# Patient Record
Sex: Male | Born: 1975 | Race: White | Hispanic: No | Marital: Single | Smoking: Former smoker
Health system: Southern US, Community
[De-identification: ages and names within clinical notes are randomized; demographics above are authoritative.]

## PROBLEM LIST (undated history)

## (undated) DIAGNOSIS — E785 Hyperlipidemia, unspecified: Secondary | ICD-10-CM

## (undated) DIAGNOSIS — F419 Anxiety disorder, unspecified: Secondary | ICD-10-CM

## (undated) DIAGNOSIS — G4730 Sleep apnea, unspecified: Secondary | ICD-10-CM

## (undated) DIAGNOSIS — G039 Meningitis, unspecified: Secondary | ICD-10-CM

## (undated) DIAGNOSIS — Z87442 Personal history of urinary calculi: Secondary | ICD-10-CM

## (undated) DIAGNOSIS — R7303 Prediabetes: Secondary | ICD-10-CM

## (undated) HISTORY — PX: TENDON REPAIR: SHX5111

---

## 2011-01-02 ENCOUNTER — Emergency Department (HOSPITAL_COMMUNITY): Payer: Self-pay

## 2011-01-02 ENCOUNTER — Encounter (HOSPITAL_COMMUNITY): Payer: Self-pay | Admitting: Anesthesiology

## 2011-01-02 ENCOUNTER — Inpatient Hospital Stay (HOSPITAL_COMMUNITY)
Admission: EM | Admit: 2011-01-02 | Discharge: 2011-01-05 | DRG: 501 | Disposition: A | Discharge status: Home / Self Care | Payer: No Typology Code available for payment source | Attending: Orthopedic Surgery | Admitting: Orthopedic Surgery

## 2011-01-02 ENCOUNTER — Inpatient Hospital Stay (HOSPITAL_COMMUNITY): Payer: No Typology Code available for payment source | Admitting: Anesthesiology

## 2011-01-02 ENCOUNTER — Other Ambulatory Visit: Payer: Self-pay

## 2011-01-02 ENCOUNTER — Encounter (HOSPITAL_COMMUNITY)
Admission: EM | Disposition: A | Discharge status: Home / Self Care | Payer: Self-pay | Source: Home / Self Care | Attending: Orthopedic Surgery

## 2011-01-02 DIAGNOSIS — Y998 Other external cause status: Secondary | ICD-10-CM

## 2011-01-02 DIAGNOSIS — S8990XA Unspecified injury of unspecified lower leg, initial encounter: Secondary | ICD-10-CM

## 2011-01-02 DIAGNOSIS — S8253XA Displaced fracture of medial malleolus of unspecified tibia, initial encounter for closed fracture: Principal | ICD-10-CM | POA: Diagnosis present

## 2011-01-02 DIAGNOSIS — Y92410 Unspecified street and highway as the place of occurrence of the external cause: Secondary | ICD-10-CM

## 2011-01-02 DIAGNOSIS — IMO0002 Reserved for concepts with insufficient information to code with codable children: Secondary | ICD-10-CM | POA: Diagnosis present

## 2011-01-02 DIAGNOSIS — S81009A Unspecified open wound, unspecified knee, initial encounter: Secondary | ICD-10-CM | POA: Diagnosis present

## 2011-01-02 DIAGNOSIS — F17200 Nicotine dependence, unspecified, uncomplicated: Secondary | ICD-10-CM | POA: Diagnosis present

## 2011-01-02 DIAGNOSIS — S32009A Unspecified fracture of unspecified lumbar vertebra, initial encounter for closed fracture: Secondary | ICD-10-CM | POA: Diagnosis present

## 2011-01-02 HISTORY — DX: Meningitis, unspecified: G03.9

## 2011-01-02 HISTORY — PX: IRRIGATION AND DEBRIDEMENT KNEE: SHX5185

## 2011-01-02 LAB — URINALYSIS, MICROSCOPIC ONLY
Bilirubin Urine: NEGATIVE
Nitrite: NEGATIVE
Urobilinogen, UA: 0.2 mg/dL (ref 0.0–1.0)

## 2011-01-02 LAB — RAPID URINE DRUG SCREEN, HOSP PERFORMED
Amphetamines: NOT DETECTED
Barbiturates: NOT DETECTED
Benzodiazepines: NOT DETECTED
Cocaine: NOT DETECTED
Tetrahydrocannabinol: NOT DETECTED

## 2011-01-02 LAB — CBC
HCT: 45.2 % (ref 39.0–52.0)
Hemoglobin: 16.1 g/dL (ref 13.0–17.0)
MCH: 33.9 pg (ref 26.0–34.0)
MCHC: 35.6 g/dL (ref 30.0–36.0)
RBC: 4.75 MIL/uL (ref 4.22–5.81)

## 2011-01-02 LAB — POCT I-STAT, CHEM 8
HCT: 48 % (ref 39.0–52.0)
Hemoglobin: 16.3 g/dL (ref 13.0–17.0)
Potassium: 3.4 mEq/L — ABNORMAL LOW (ref 3.5–5.1)
Sodium: 141 mEq/L (ref 135–145)

## 2011-01-02 LAB — COMPREHENSIVE METABOLIC PANEL
ALT: 85 U/L — ABNORMAL HIGH (ref 0–53)
AST: 45 U/L — ABNORMAL HIGH (ref 0–37)
Calcium: 9.6 mg/dL (ref 8.4–10.5)
Creatinine, Ser: 0.96 mg/dL (ref 0.50–1.35)
Sodium: 138 mEq/L (ref 135–145)
Total Protein: 7.4 g/dL (ref 6.0–8.3)

## 2011-01-02 LAB — SAMPLE TO BLOOD BANK

## 2011-01-02 SURGERY — IRRIGATION AND DEBRIDEMENT KNEE
Anesthesia: General | Site: Knee | Laterality: Left | Wound class: Dirty or Infected

## 2011-01-02 MED ORDER — METOCLOPRAMIDE HCL 5 MG/ML IJ SOLN
5.0000 mg | Freq: Three times a day (TID) | INTRAMUSCULAR | Status: DC | PRN
  Filled 2011-01-02: qty 2

## 2011-01-02 MED ORDER — HYDROMORPHONE HCL PF 1 MG/ML IJ SOLN
1.0000 mg | INTRAMUSCULAR | Status: DC | PRN
  Administered 2011-01-02: 0.5 mg via INTRAVENOUS
  Administered 2011-01-02 (×2): 1 mg via INTRAVENOUS
  Administered 2011-01-02: 0.5 mg via INTRAVENOUS
  Administered 2011-01-03: 1 mg via INTRAVENOUS
  Filled 2011-01-02 (×3): qty 1

## 2011-01-02 MED ORDER — VANCOMYCIN HCL IN DEXTROSE 1-5 GM/200ML-% IV SOLN
1000.0000 mg | Freq: Once | INTRAVENOUS | Status: AC
  Administered 2011-01-02: 1000 mg via INTRAVENOUS
  Filled 2011-01-02: qty 200

## 2011-01-02 MED ORDER — IOHEXOL 300 MG/ML  SOLN
100.0000 mL | Freq: Once | INTRAMUSCULAR | Status: AC | PRN
  Administered 2011-01-02: 100 mL via INTRAVENOUS

## 2011-01-02 MED ORDER — ONDANSETRON HCL 4 MG/2ML IJ SOLN: 4.0000 mg | Freq: Four times a day (QID) | INTRAMUSCULAR | Status: DC | PRN

## 2011-01-02 MED ORDER — SENNOSIDES-DOCUSATE SODIUM 8.6-50 MG PO TABS: 1.0000 | ORAL_TABLET | Freq: Every evening | ORAL | Status: DC | PRN

## 2011-01-02 MED ORDER — MEPERIDINE HCL 25 MG/ML IJ SOLN: 6.2500 mg | INTRAMUSCULAR | Status: DC | PRN

## 2011-01-02 MED ORDER — SODIUM CHLORIDE 0.9 % IV BOLUS (SEPSIS): 1000.0000 mL | Freq: Once | INTRAVENOUS | Status: DC

## 2011-01-02 MED ORDER — VANCOMYCIN HCL 1000 MG IV SOLR
1000.0000 mg | INTRAVENOUS | Status: DC | PRN
  Administered 2011-01-02: 1 g via INTRAVENOUS

## 2011-01-02 MED ORDER — SUFENTANIL CITRATE 50 MCG/ML IV SOLN
INTRAVENOUS | Status: DC | PRN
  Administered 2011-01-02 (×2): 20 ug via INTRAVENOUS
  Administered 2011-01-02: 10 ug via INTRAVENOUS

## 2011-01-02 MED ORDER — FENTANYL CITRATE 0.05 MG/ML IJ SOLN
25.0000 ug | INTRAMUSCULAR | Status: DC | PRN
  Administered 2011-01-02: 25 ug via INTRAVENOUS

## 2011-01-02 MED ORDER — ONDANSETRON HCL 4 MG/2ML IJ SOLN
4.0000 mg | Freq: Once | INTRAMUSCULAR | Status: AC
  Administered 2011-01-02: 4 mg via INTRAVENOUS

## 2011-01-02 MED ORDER — LACTATED RINGERS IV SOLN
INTRAVENOUS | Status: DC | PRN
  Administered 2011-01-02 (×2): via INTRAVENOUS

## 2011-01-02 MED ORDER — HYDROMORPHONE HCL PF 1 MG/ML IJ SOLN
1.0000 mg | Freq: Once | INTRAMUSCULAR | Status: AC
  Administered 2011-01-02: 1 mg via INTRAVENOUS
  Filled 2011-01-02: qty 1

## 2011-01-02 MED ORDER — FENTANYL CITRATE 0.05 MG/ML IJ SOLN
50.0000 ug | Freq: Once | INTRAMUSCULAR | Status: AC
  Administered 2011-01-02: 50 ug via INTRAVENOUS

## 2011-01-02 MED ORDER — PROPOFOL 10 MG/ML IV EMUL
INTRAVENOUS | Status: DC | PRN
  Administered 2011-01-02: 200 mg via INTRAVENOUS

## 2011-01-02 MED ORDER — METOCLOPRAMIDE HCL 10 MG PO TABS
5.0000 mg | ORAL_TABLET | Freq: Three times a day (TID) | ORAL | Status: DC | PRN
Start: 2011-01-02 — End: 2011-01-05

## 2011-01-02 MED ORDER — HYDROMORPHONE HCL PF 1 MG/ML IJ SOLN
0.5000 mg | INTRAMUSCULAR | Status: DC | PRN
  Administered 2011-01-04 – 2011-01-05 (×9): 1 mg via INTRAVENOUS
  Filled 2011-01-02 (×9): qty 1

## 2011-01-02 MED ORDER — MORPHINE SULFATE 2 MG/ML IJ SOLN: 2.0000 mg | INTRAMUSCULAR | Status: DC | PRN

## 2011-01-02 MED ORDER — ONDANSETRON HCL 4 MG PO TABS: 4.0000 mg | ORAL_TABLET | Freq: Four times a day (QID) | ORAL | Status: DC | PRN

## 2011-01-02 MED ORDER — SUCCINYLCHOLINE CHLORIDE 20 MG/ML IJ SOLN
INTRAMUSCULAR | Status: DC | PRN
  Administered 2011-01-02: 100 mg via INTRAVENOUS

## 2011-01-02 MED ORDER — PROMETHAZINE HCL 25 MG/ML IJ SOLN: 6.2500 mg | INTRAMUSCULAR | Status: DC | PRN

## 2011-01-02 MED ORDER — SODIUM CHLORIDE 0.9 % IJ SOLN
INTRAMUSCULAR | Status: DC | PRN
  Administered 2011-01-02: 55 mL via INTRAVENOUS

## 2011-01-02 MED ORDER — ASPIRIN 325 MG PO TABS
325.0000 mg | ORAL_TABLET | Freq: Every day | ORAL | Status: DC
  Administered 2011-01-03 – 2011-01-05 (×3): 325 mg via ORAL
  Filled 2011-01-02 (×3): qty 1

## 2011-01-02 MED ORDER — METHYLENE BLUE 1 % INJ SOLN
INTRAMUSCULAR | Status: DC | PRN
  Administered 2011-01-02: 5 mL

## 2011-01-02 MED ORDER — HYDROMORPHONE HCL PF 1 MG/ML IJ SOLN
INTRAMUSCULAR | Status: AC
  Administered 2011-01-02: 0.5 mg via INTRAVENOUS
  Filled 2011-01-02: qty 1

## 2011-01-02 MED ORDER — METHOCARBAMOL 100 MG/ML IJ SOLN
500.0000 mg | Freq: Four times a day (QID) | INTRAVENOUS | Status: DC | PRN
  Administered 2011-01-04: 500 mg via INTRAVENOUS
  Filled 2011-01-02 (×2): qty 5

## 2011-01-02 MED ORDER — OXYCODONE-ACETAMINOPHEN 5-325 MG PO TABS
1.0000 | ORAL_TABLET | ORAL | Status: DC | PRN
  Administered 2011-01-02 (×2): 1 via ORAL
  Administered 2011-01-03 – 2011-01-05 (×13): 2 via ORAL
  Filled 2011-01-02 (×4): qty 2
  Filled 2011-01-02: qty 1
  Filled 2011-01-02 (×4): qty 2
  Filled 2011-01-02: qty 1
  Filled 2011-01-02 (×5): qty 2

## 2011-01-02 MED ORDER — CLINDAMYCIN PHOSPHATE 600 MG/50ML IV SOLN
600.0000 mg | INTRAVENOUS | Status: DC
  Filled 2011-01-02 (×2): qty 50

## 2011-01-02 MED ORDER — VANCOMYCIN HCL IN DEXTROSE 1-5 GM/200ML-% IV SOLN
1000.0000 mg | Freq: Two times a day (BID) | INTRAVENOUS | Status: AC
  Administered 2011-01-03: 1000 mg via INTRAVENOUS
  Filled 2011-01-02 (×2): qty 200

## 2011-01-02 MED ORDER — HYDROMORPHONE HCL PF 1 MG/ML IJ SOLN
0.2500 mg | INTRAMUSCULAR | Status: DC | PRN
  Administered 2011-01-02 (×2): 0.5 mg via INTRAVENOUS

## 2011-01-02 MED ORDER — KCL IN DEXTROSE-NACL 20-5-0.9 MEQ/L-%-% IV SOLN
INTRAVENOUS | Status: AC
  Administered 2011-01-02 – 2011-01-03 (×2): via INTRAVENOUS
  Filled 2011-01-02 (×2): qty 1000

## 2011-01-02 MED ORDER — POTASSIUM CHLORIDE IN NACL 20-0.9 MEQ/L-% IV SOLN
INTRAVENOUS | Status: DC
  Administered 2011-01-02: 14:00:00 via INTRAVENOUS
  Filled 2011-01-02 (×2): qty 1000

## 2011-01-02 MED ORDER — METHOCARBAMOL 500 MG PO TABS
500.0000 mg | ORAL_TABLET | Freq: Four times a day (QID) | ORAL | Status: DC | PRN
  Administered 2011-01-02 – 2011-01-05 (×6): 500 mg via ORAL
  Filled 2011-01-02 (×6): qty 1

## 2011-01-02 MED ORDER — MIDAZOLAM HCL 5 MG/5ML IJ SOLN
INTRAMUSCULAR | Status: DC | PRN
  Administered 2011-01-02: 2 mg via INTRAVENOUS

## 2011-01-02 MED ORDER — CHLORHEXIDINE GLUCONATE 4 % EX LIQD
60.0000 mL | Freq: Once | CUTANEOUS | Status: AC
  Administered 2011-01-02: 4 via TOPICAL
  Filled 2011-01-02: qty 60

## 2011-01-02 SURGICAL SUPPLY — 11 items
BANDAGE ELASTIC 6 VELCRO ST LF (GAUZE/BANDAGES/DRESSINGS) ×2 IMPLANT
BANDAGE GAUZE ELAST BULKY 4 IN (GAUZE/BANDAGES/DRESSINGS) ×2 IMPLANT
DRSG ADAPTIC 3X8 NADH LF (GAUZE/BANDAGES/DRESSINGS) ×4 IMPLANT
FLUID NSS /IRRIG 3000 ML XXX (IV SOLUTION) ×2 IMPLANT
GAUZE SPONGE 4X4 12PLY STRL LF (GAUZE/BANDAGES/DRESSINGS) ×4 IMPLANT
HANDPIECE INTERPULSE COAX TIP (DISPOSABLE) ×1
IMMOBILIZER KNEE 22 UNIV (SOFTGOODS) ×2 IMPLANT
KIT BASIN OR (CUSTOM PROCEDURE TRAY) ×2 IMPLANT
PACK GENERAL/GYN (CUSTOM PROCEDURE TRAY) ×2 IMPLANT
PADDING WEBRIL 4 STERILE (GAUZE/BANDAGES/DRESSINGS) ×2 IMPLANT
SET HNDPC FAN SPRY TIP SCT (DISPOSABLE) ×1 IMPLANT

## 2011-01-02 NOTE — ED Notes (Signed)
Pt with dr dean at bedsdie, requesting ice , per md none at this time. Due to possibility of OR. Pt denies any other needs, dr dean unwrapped knee and will rewrap.

## 2011-01-02 NOTE — ED Notes (Signed)
Dr. Dean at bedside with patient.

## 2011-01-02 NOTE — ED Notes (Signed)
Pt given oral swabs
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
## 2011-01-02 NOTE — Brief Op Note (Signed)
01/02/2011  7:07 PM  PATIENT:  Tony Galvan  35 y.o. male  PRE-OPERATIVE DIAGNOSIS:  left knee laceration - complex  POST-OPERATIVE DIAGNOSIS:  left knee laceration - complex  PROCEDURE:  Procedure(s): IRRIGATION AND DEBRIDEMENT KNEE, closre of knee laceration  SURGEON:  Surgeon(s): Cammy Copa  ASSISTANT:   ANESTHESIA:   general  EBL: 30 ml       BLOOD ADMINISTERED: none  DRAINS: none   LOCAL MEDICATIONS USED:  none  SPECIMEN:  No Specimen  COUNTS:  YES  TOURNIQUET:  * No tourniquets in log *  DICTATION: .Other Dictation: Dictation Number dictated in record  PLAN OF CARE: Admit to inpatient   PATIENT DISPOSITION:  PACU - hemodynamically stable
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
## 2011-01-02 NOTE — Anesthesia Preprocedure Evaluation (Addendum)
Anesthesia Evaluation  Patient identified by MRN, date of birth, ID band Patient awake    Reviewed: Allergy & Precautions  Airway Mallampati: II      Dental   Pulmonary          Cardiovascular     Neuro/Psych    GI/Hepatic   Endo/Other    Renal/GU      Musculoskeletal   Abdominal   Peds  Hematology   Anesthesia Other Findings   Reproductive/Obstetrics                          Anesthesia Physical Anesthesia Plan  ASA: II and Emergent  Anesthesia Plan: General   Post-op Pain Management:    Induction:   Airway Management Planned: Oral ETT  Additional Equipment:   Intra-op Plan:   Post-operative Plan: Extubation in OR  Informed Consent: I have reviewed the patients History and Physical, chart, labs and discussed the procedure including the risks, benefits and alternatives for the proposed anesthesia with the patient or authorized representative who has indicated his/her understanding and acceptance.   Dental advisory given  Plan Discussed with: CRNA  Anesthesia Plan Comments:         Anesthesia Quick Evaluation

## 2011-01-02 NOTE — Anesthesia Postprocedure Evaluation (Signed)
  Anesthesia Post-op Note  Patient: Tony Galvan  Procedure(s) Performed:  IRRIGATION AND DEBRIDEMENT KNEE  Patient Location: PACU  Anesthesia Type: General  Level of Consciousness: awake  Airway and Oxygen Therapy: Patient Spontanous Breathing  Post-op Pain: mild  Post-op Assessment: Post-op Vital signs reviewed  Post-op Vital Signs: stable  Complications: No apparent anesthesia complications

## 2011-01-02 NOTE — ED Notes (Signed)
Pt knows that urine is needed 
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
## 2011-01-02 NOTE — Anesthesia Procedure Notes (Signed)
Procedure Name: Intubation Date/Time: 01/02/2011 5:41 PM Performed by: Carolyne Littles, AMY Pre-anesthesia Checklist: Patient identified, Emergency Drugs available, Suction available, Patient being monitored and Timeout performed Patient Re-evaluated:Patient Re-evaluated prior to inductionOxygen Delivery Method: Circle System Utilized Preoxygenation: Pre-oxygenation with 100% oxygen Intubation Type: IV induction, Rapid sequence and Circoid Pressure applied Grade View: Grade II Tube type: Oral Tube size: 7.5 mm Number of attempts: 2 Airway Equipment and Method: video-laryngoscopy Placement Confirmation: ETT inserted through vocal cords under direct vision,  positive ETCO2 and breath sounds checked- equal and bilateral Secured at: 22 cm Tube secured with: Tape Dental Injury: Teeth and Oropharynx as per pre-operative assessment

## 2011-01-02 NOTE — ED Notes (Signed)
Pt still remains in pain, family at bedside, no orders for pain per admission.

## 2011-01-02 NOTE — ED Notes (Signed)
Dr dean contacted about admission orders, sts he is on the way down per or staff.

## 2011-01-02 NOTE — Progress Notes (Signed)
  I was asked to evaluate patient's c-spine in the pre-op area.  Patient is awake and alert and denies neck pain. Neck exam shows no tenderness posteriorly.  Patient had no posterior neck pain with AROM. The collar was removed.

## 2011-01-02 NOTE — ED Notes (Signed)
nad noted, awaiting meds to cross over, family at bedside, abc intact. Reposition in bed.

## 2011-01-02 NOTE — ED Notes (Signed)
Pt transported to xray and CT

## 2011-01-02 NOTE — Transfer of Care (Signed)
Immediate Anesthesia Transfer of Care Note  Patient: Tony Galvan  Procedure(s) Performed:  IRRIGATION AND DEBRIDEMENT KNEE  Patient Location: PACU  Anesthesia Type: General  Level of Consciousness: awake  Airway & Oxygen Therapy: Patient Spontanous Breathing and Patient connected to face mask oxygen  Post-op Assessment: Report given to PACU RN and Post -op Vital signs reviewed and stable  Post vital signs: Reviewed and stable  Complications: No apparent anesthesia complications

## 2011-01-02 NOTE — Preoperative (Signed)
Beta Blockers   Reason not to administer Beta Blockers:Not Applicable 
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
## 2011-01-03 NOTE — Progress Notes (Signed)
Subjective: My knee hurts - otherwise ok mobilizing with pt - req iv pain meds    Objective: Vital signs in last 24 hours: Temp:  [98.3 F (36.8 C)-100 F (37.8 C)] 99 F (37.2 C) (11/12 0530) Pulse Rate:  [82-127] 99  (11/12 0530) Resp:  [16-23] 18  (11/12 0530) BP: (111-145)/(60-74) 119/69 mmHg (11/12 0530) SpO2:  [91 %-97 %] 94 % (11/12 0530) Weight:  [93.1 kg (205 lb 4 oz)] 205 lb 4 oz (93.1 kg) (11/11 1711)  Intake/Output from previous day: 11/11 0701 - 11/12 0700 In: 1600 [I.V.:1600] Out: 250 [Urine:200; Blood:50] Intake/Output this shift:    Exam:  Sensation intact distally Intact pulses distally Dorsiflexion/Plantar flexion intact  Labs:  Basename 01/02/11 0404 01/02/11 0349  HGB 16.3 16.1    Basename 01/02/11 0404 01/02/11 0349  WBC -- 11.0*  RBC -- 4.75  HCT 48.0 45.2  PLT -- 232    Basename 01/02/11 0404 01/02/11 0349  NA 141 138  K 3.4* 3.4*  CL 103 100  CO2 -- 22  BUN 10 10  CREATININE 1.40* 0.96  GLUCOSE 107* 105*  CALCIUM -- 9.6    Basename 01/02/11 0349  LABPT --  INR 0.98    Assessment/Plan: Pt stable needs more time to mobilize with pt anticipate dc am  Darneshia Demary SCOTT 01/03/2011, 11:12 AM

## 2011-01-04 MED ORDER — LACTATED RINGERS IV SOLN
INTRAVENOUS | Status: DC
  Administered 2011-01-04: 12:00:00 via INTRAVENOUS

## 2011-01-04 NOTE — Progress Notes (Signed)
Occupational Therapy Evaluation Patient Details Name: Tony Galvan MRN: [id number] DOB: Jun 16, 1975 Today's Date: 01/04/2011  Problem List: There is no problem list on file for this patient.   Past Medical History:  Past Medical History  Diagnosis Date  . Meningitis    Past Surgical History:  Past Surgical History  Procedure Date  . Tendon repair     OT Assessment/Plan/Recommendation OT Assessment Clinical Impression Statement: Pt. requires max to total A with LB ADLs. Recommend 3:1 and HHOT for safe d/c home. Will follow acutely to increase independence with LB ADLs to Min A using AE for safe d/c home.  OT Recommendation/Assessment: Patient will need skilled OT in the acute care venue (recommend HHOT and 3:1 for safe d/c home) OT Problem List: Decreased activity tolerance;Decreased knowledge of use of DME or AE;Pain Barriers to Discharge: None OT Therapy Diagnosis : Generalized weakness;Acute pain OT Plan OT Frequency: Min 2X/week OT Treatment/Interventions: Self-care/ADL training;Energy conservation;DME and/or AE instruction;Therapeutic activities;Patient/family education OT Recommendation Follow Up Recommendations: Home health OT Equipment Recommended: Rolling walker with 5" wheels;3 in 1 bedside comode Individuals Consulted Consulted and Agree with Results and Recommendations: Patient OT Goals Acute Rehab OT Goals OT Goal Formulation: With patient Time For Goal Achievement: 2 weeks ADL Goals Pt Will Perform Lower Body Bathing: with min assist;Sitting, edge of bed Pt Will Perform Lower Body Dressing: with min assist;Unsupported;with adaptive equipment;Sit to stand from bed  OT Evaluation Precautions/Restrictions  Precautions Precautions: Other (comment) (Left KI) Required Braces or Orthoses: Yes Knee Immobilizer: On at all times Restrictions Weight Bearing Restrictions: Yes LLE Weight Bearing: Weight bearing as tolerated Prior Functioning Home Living Lives  With: Family Receives Help From: Family;Other (Comment) Type of Home: Apartment Home Layout: One level Home Access: Stairs to enter Entrance Stairs-Rails: Right;Left;Can reach both Entrance Stairs-Number of Steps: 12 Bathroom Shower/Tub: Tub/shower unit;Curtain;Other (comment) Bathroom Toilet: Standard Bathroom Accessibility: Yes How Accessible: Accessible via wheelchair;Accessible via walker Home Adaptive Equipment: Hand-held shower hose Prior Function Level of Independence: Independent with homemaking with ambulation;Independent with basic ADLs Able to Take Stairs?: Yes Driving: Yes Vocation: Full time employment Vocation Requirements: lifting between 1lb to 75lb, standing, walking constantly Leisure: Hobbies-yes (Comment) Comments: riding bike ADL ADL Eating/Feeding: Simulated;Modified independent Where Assessed - Eating/Feeding: Edge of bed Grooming: Performed;Teeth care;Set up Where Assessed - Grooming: Standing at sink Upper Body Bathing: Simulated;Set up Where Assessed - Upper Body Bathing: Sitting, bed;Unsupported Lower Body Bathing: Simulated;Maximal assistance Where Assessed - Lower Body Bathing: Sit to stand from bed;Unsupported Upper Body Dressing: Performed;Set up Where Assessed - Upper Body Dressing: Sitting, bed;Unsupported Lower Body Dressing: Performed;+1 Total assistance Where Assessed - Lower Body Dressing: Sit to stand from bed;Unsupported Toilet Transfer: Simulated;Supervision/safety Toilet Transfer Method: Stand pivot Acupuncturist: Raised toilet seat with arms (or 3-in-1 over toilet) Toileting - Clothing Manipulation: Simulated;Supervision/safety Where Assessed - Toileting Clothing Manipulation: Sit to stand from 3-in-1 or toilet Toileting - Hygiene: Simulated;Supervision/safety Where Assessed - Toileting Hygiene: Sit to stand from 3-in-1 or toilet Tub/Shower Transfer: Simulated;Supervision/safety Tub/Shower Transfer Method:  Science writer: Counsellor Used: Rolling walker;Other (comment) (gait belt) ADL Comments: Educated pt. re; purpose of tub bench, how to use tub bench, technique for bathing without getting left LE wet. Pt. Ambulated with gait velocity of 36ft in 1 min (.63) indicating fall risk.  Vision/Perception  Vision - History Baseline Vision: No visual deficits Patient Visual Report: No change from baseline Vision - Assessment Eye Alignment: Within Functional Limits Vision Assessment: Vision not tested  Cognition Cognition Arousal/Alertness: Awake/alert Overall Cognitive Status: Appears within functional limits for tasks assessed Orientation Level: Oriented X4 Sensation/Coordination Sensation Light Touch: Appears Intact Coordination Gross Motor Movements are Fluid and Coordinated: Yes Fine Motor Movements are Fluid and Coordinated: Yes Extremity Assessment RUE Assessment RUE Assessment: Within Functional Limits LUE Assessment LUE Assessment: Within Functional Limits Mobility  Bed Mobility Bed Mobility: Yes Supine to Sit: 5: Supervision;With rails;HOB elevated (Comment degrees) (20 degrees) Sitting - Scoot to Edge of Bed: 5: Supervision;With rail Transfers Transfers: Yes Sit to Stand: 5: Supervision;From bed;Other (comment) (with RW) Stand to Sit: 5: Supervision;To bed;Other (comment) (with RW) Exercises   End of Session OT - End of Session Activity Tolerance: Patient tolerated treatment well Patient left: in bed;with call bell in reach General Behavior During Session: [facility] for tasks performed Cognition: [facility] for tasks performed   Otis Peak OTS 01/04/2011, 12:41 PM   01/04/2011 Lucile Shutters   OTR/L Pager: [phone] Office: [phone] .

## 2011-01-04 NOTE — Progress Notes (Signed)
Physical Therapy Treatment Patient Details Name: Zaivion Kundrat MRN: [id number] DOB: Jul 13, 1975 Today's Date: 01/04/2011  PT Assessment/Plan  PT - Assessment/Plan Comments on Treatment Session: Pt progressing very well today. Pt should be ready for DC tomorrow from PT standpoint as he is progressing very well towards his PT goals PT Plan: Discharge plan remains appropriate PT Frequency: Min 6X/week Follow Up Recommendations: Home health PT Equipment Recommended: Rolling walker with 5" wheels;3 in 1 bedside comode PT Goals  Acute Rehab PT Goals PT Goal: Supine/Side to Sit - Progress: Progressing toward goal PT Goal: Sit to Supine/Side - Progress: Progressing toward goal PT Transfer Goal: Sit to Stand/Stand to Sit - Progress: Progressing toward goal PT Goal: Ambulate - Progress: Progressing toward goal PT Goal: Up/Down Stairs - Progress: Progressing toward goal  PT Treatment Precautions/Restrictions  Precautions Precautions: Other (comment) (Left KI) Required Braces or Orthoses: Yes Knee Immobilizer: On at all times Restrictions Weight Bearing Restrictions: Yes LLE Weight Bearing: Weight bearing as tolerated Mobility (including Balance) Bed Mobility Bed Mobility: Yes Supine to Sit: 5: Supervision;With rails;HOB elevated (Comment degrees) (20 degrees) Sitting - Scoot to Edge of Bed: 5: Supervision;With rail Sit to Supine - Left: 6: Modified independent (Device/Increase time) Transfers Transfers: Yes Sit to Stand: 5: Supervision;From bed Stand to Sit: 5: Supervision;To bed Ambulation/Gait Ambulation/Gait: Yes Ambulation/Gait Assistance: 5: Supervision Ambulation/Gait Assistance Details (indicate cue type and reason): cues for safest use of RW Ambulation Distance (Feet): 225 Feet Assistive device: Rolling walker Gait Pattern: Within Functional Limits;Step-through pattern Stairs: Yes Stairs Assistance: 5: Supervision Stairs Assistance Details (indicate cue type and  reason): cues for sequency and technique Stair Management Technique: Two rails Number of Stairs: 5     Exercise    End of Session PT - End of Session Equipment Utilized During Treatment: Gait belt;Left knee immobilizer Activity Tolerance: Patient tolerated treatment well Patient left: in bed;with call bell in reach Nurse Communication: Mobility status for transfers;Mobility status for ambulation General Behavior During Session: [facility] for tasks performed Cognition: [facility] for tasks performed  Robinette, Adline Potter 01/04/2011, 1:00 PM 01/04/2011 Fredrich Birks PTA [phone] pager [phone] office

## 2011-01-04 NOTE — Progress Notes (Signed)
Assessment complete,pt complaining of generalized pain rate of 10,pain meds and muscle relaxants to be given. Pt provided comfort measures. Dressings intact to back area,new iv started to right forearm. Pt assisted up to bathroom with use of walker,yellow bracelet applied. Will continue to monitor.

## 2011-01-04 NOTE — Progress Notes (Signed)
Subjective: Knee hurts but feeling better - pt is mobilizing in hall   Objective: Vital signs in last 24 hours: Temp:  [98.2 F (36.8 C)-98.9 F (37.2 C)] 98.2 F (36.8 C) (11/13 1347) Pulse Rate:  [92-119] 92  (11/13 1347) Resp:  [18-20] 20  (11/13 1347) BP: (88-156)/(68-76) 156/70 mmHg (11/13 1347) SpO2:  [96 %-98 %] 98 % (11/13 1347)  Intake/Output from previous day: 11/12 0701 - 11/13 0700 In: 1560 [P.O.:1560] Out: 251 [Urine:250; Stool:1] Intake/Output this shift: Total I/O In: 360 [P.O.:360] Out: 450 [Urine:450]  Exam:  Neurovascular intact Sensation intact distally Intact pulses distally Dorsiflexion/Plantar flexion intact  Labs:  Basename 01/02/11 0404 01/02/11 0349  HGB 16.3 16.1    Basename 01/02/11 0404 01/02/11 0349  WBC -- 11.0*  RBC -- 4.75  HCT 48.0 45.2  PLT -- 232    Basename 01/02/11 0404 01/02/11 0349  NA 141 138  K 3.4* 3.4*  CL 103 100  CO2 -- 22  BUN 10 10  CREATININE 1.40* 0.96  GLUCOSE 107* 105*  CALCIUM -- 9.6    Basename 01/02/11 0349  LABPT --  INR 0.98    Assessment/Plan: Pt needs one more day of pt his pain is controlled will plan on dc am   DEAN,GREGORY SCOTT 01/04/2011, 2:57 PM

## 2011-01-05 ENCOUNTER — Encounter (HOSPITAL_COMMUNITY): Payer: Self-pay | Admitting: Orthopedic Surgery

## 2011-01-05 LAB — URINALYSIS, ROUTINE W REFLEX MICROSCOPIC
Bilirubin Urine: NEGATIVE
Hgb urine dipstick: NEGATIVE
Nitrite: NEGATIVE
Protein, ur: NEGATIVE mg/dL
Specific Gravity, Urine: 1.018 (ref 1.005–1.030)
Urobilinogen, UA: 1 mg/dL (ref 0.0–1.0)

## 2011-01-05 MED ORDER — METHOCARBAMOL 500 MG PO TABS: 500.0000 mg | ORAL_TABLET | Freq: Four times a day (QID) | ORAL | Status: AC | PRN

## 2011-01-05 MED ORDER — ASPIRIN 325 MG PO TABS: 325.0000 mg | ORAL_TABLET | Freq: Every day | ORAL | Status: AC

## 2011-01-05 MED ORDER — OXYCODONE-ACETAMINOPHEN 5-325 MG PO TABS: 1.0000 | ORAL_TABLET | ORAL | Status: AC | PRN

## 2011-01-05 NOTE — Progress Notes (Signed)
Patient needs rolling walker and 3in1 for home. Entered in TLC.

## 2011-01-05 NOTE — Progress Notes (Signed)
Physical Therapy Treatment Patient Details Name: Tony Galvan MRN: [id number] DOB: February 18, 1976 Today's Date: 01/05/2011  PT Assessment/Plan  PT - Assessment/Plan Comments on Treatment Session: Pt. progressing well . Planning on discharging to home with parents today.  PT Plan: Discharge plan remains appropriate PT Frequency: Min 6X/week Follow Up Recommendations: Home health PT Equipment Recommended: 3 in 1 bedside comode;Rolling walker with 5" wheels PT Goals  Acute Rehab PT Goals PT Goal: Supine/Side to Sit - Progress: Met PT Goal: Sit to Supine/Side - Progress: Met PT Transfer Goal: Sit to Stand/Stand to Sit - Progress: Met PT Goal: Ambulate - Progress: Met PT Goal: Up/Down Stairs - Progress: Progressing toward goal  PT Treatment Precautions/Restrictions  Precautions Precautions: Other (comment) (Left KI) Required Braces or Orthoses: Yes Knee Immobilizer: On at all times Restrictions Weight Bearing Restrictions: Yes LLE Weight Bearing: Weight bearing as tolerated Mobility (including Balance) Bed Mobility Supine to Sit: 6: Modified independent (Device/Increase time) Sit to Supine - Left: 6: Modified independent (Device/Increase time) Transfers Sit to Stand: 6: Modified independent (Device/Increase time) Stand to Sit: 6: Modified independent (Device/Increase time) Ambulation/Gait Ambulation/Gait: Yes Ambulation/Gait Assistance: 6: Modified independent (Device/Increase time) Ambulation Distance (Feet): 250 Feet Assistive device: Rolling walker Gait Pattern: Step-to pattern Stairs: Yes Stairs Assistance: 5: Supervision Stair Management Technique: Two rails Number of Stairs: 10  Wheelchair Mobility Wheelchair Mobility: No    Exercise    End of Session PT - End of Session Equipment Utilized During Treatment: Gait belt;Left knee immobilizer Activity Tolerance: Patient tolerated treatment well Patient left: in bed;with call bell in reach General Behavior During  Session: [facility] for tasks performed Cognition: [facility] for tasks performed  Robinette, Adline Potter 01/05/2011, 12:00 PM 01/05/2011 Fredrich Birks PTA [phone] pager [phone] office

## 2011-01-05 NOTE — Progress Notes (Signed)
Subjective: i feel better but i am sore still   Objective: Vital signs in last 24 hours: Temp:  [98.2 F (36.8 C)-98.4 F (36.9 C)] 98.4 F (36.9 C) (11/14 0547) Pulse Rate:  [84-92] 84  (11/14 0547) Resp:  [18-20] 18  (11/14 0547) BP: (110-156)/(57-70) 110/67 mmHg (11/14 0547) SpO2:  [94 %-98 %] 96 % (11/14 0547)  Intake/Output from previous day: 11/13 0701 - 11/14 0700 In: 1890 [P.O.:1680; I.V.:100; IV Piggyback:110] Out: 1001 [Urine:1000; Stool:1] Intake/Output this shift:    Exam:  Neurovascular intact Sensation intact distally Intact pulses distally Dorsiflexion/Plantar flexion intact  Labs: No results found for this basename: HGB:5 in the last 72 hours No results found for this basename: WBC:2,RBC:2,HCT:2,PLT:2 in the last 72 hours No results found for this basename: NA:2,K:2,CL:2,CO2:2,BUN:2,CREATININE:2,GLUCOSE:2,CALCIUM:2 in the last 72 hours No results found for this basename: LABPT:2,INR:2 in the last 72 hours  Assessment/Plan: wbat - dc today - dressing change   DEAN,GREGORY SCOTT 01/05/2011, 7:08 AM

## 2011-01-05 NOTE — Progress Notes (Signed)
Utilization review completed. Kaitlynd Phillips, RN, BSN. 01/05/11 
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
## 2011-10-07 ENCOUNTER — Ambulatory Visit: Payer: Self-pay

## 2012-03-07 ENCOUNTER — Ambulatory Visit: Payer: Self-pay | Admitting: Family Medicine

## 2012-03-30 ENCOUNTER — Ambulatory Visit: Payer: Self-pay | Admitting: Emergency Medicine

## 2012-03-30 LAB — CBC WITH DIFFERENTIAL/PLATELET
Basophil #: 0.1 10*3/uL (ref 0.0–0.1)
Basophil %: 1.2 %
Eosinophil #: 0.2 10*3/uL (ref 0.0–0.7)
Eosinophil %: 3.1 %
HCT: 42.5 % (ref 40.0–52.0)
HGB: 14.5 g/dL (ref 13.0–18.0)
Lymphocyte #: 1.5 10*3/uL (ref 1.0–3.6)
Lymphocyte %: 27.3 %
MCH: 32.6 pg (ref 26.0–34.0)
MCHC: 34.2 g/dL (ref 32.0–36.0)
MCV: 95 fL (ref 80–100)
Monocyte #: 0.7 x10 3/mm (ref 0.2–1.0)
Monocyte %: 13.3 %
Neutrophil #: 2.9 10*3/uL (ref 1.4–6.5)
Neutrophil %: 55.1 %
Platelet: 164 10*3/uL (ref 150–440)
RBC: 4.46 10*6/uL (ref 4.40–5.90)
RDW: 13.1 % (ref 11.5–14.5)
WBC: 5.3 10*3/uL (ref 3.8–10.6)

## 2012-03-30 LAB — COMPREHENSIVE METABOLIC PANEL
Albumin: 3.4 g/dL (ref 3.4–5.0)
Alkaline Phosphatase: 82 U/L (ref 50–136)
Anion Gap: 9 (ref 7–16)
BUN: 8 mg/dL (ref 7–18)
Bilirubin,Total: 0.5 mg/dL (ref 0.2–1.0)
Calcium, Total: 8.6 mg/dL (ref 8.5–10.1)
Chloride: 104 mmol/L (ref 98–107)
Co2: 31 mmol/L (ref 21–32)
Creatinine: 1.08 mg/dL (ref 0.60–1.30)
EGFR (African American): >60
EGFR (Non-African Amer.): >60
Glucose: 110 mg/dL — ABNORMAL HIGH (ref 65–99)
Osmolality: 286 (ref 275–301)
Potassium: 3.4 mmol/L — ABNORMAL LOW (ref 3.5–5.1)
SGOT(AST): 29 U/L (ref 15–37)
SGPT (ALT): 75 U/L (ref 12–78)
Sodium: 144 mmol/L (ref 136–145)
Total Protein: 6.8 g/dL (ref 6.4–8.2)
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
## 2013-03-31 ENCOUNTER — Ambulatory Visit: Payer: Self-pay | Admitting: Physician Assistant

## 2015-05-07 ENCOUNTER — Ambulatory Visit
Admission: EM | Admit: 2015-05-07 | Discharge: 2015-05-07 | Disposition: A | Discharge status: Home / Self Care | Payer: No Typology Code available for payment source | Attending: Family Medicine | Admitting: Family Medicine

## 2015-05-07 DIAGNOSIS — J0110 Acute frontal sinusitis, unspecified: Secondary | ICD-10-CM

## 2015-05-07 MED ORDER — AZITHROMYCIN 250 MG PO TABS: ORAL_TABLET | ORAL | Status: DC

## 2015-05-07 MED ORDER — HYDROCOD POLST-CPM POLST ER 10-8 MG/5ML PO SUER: 5.0000 mL | Freq: Two times a day (BID) | ORAL | Status: DC

## 2015-05-07 NOTE — ED Provider Notes (Signed)
CSN: [id number]     Arrival date & time 05/07/15  1001 History   First MD Initiated Contact with Patient 05/07/15 1052     Chief Complaint  Patient presents with  . URI   (Consider location/radiation/quality/duration/timing/severity/associated sxs/prior Treatment) HPI   Is a 40 year old male presents with coughing body aches and sinus congestion and pressure which started about a week and half ago. States that one of his coworkers had exact same symptoms but appeared much sicker. He denies any fever or chills. He has had some greenish mucus from his nose. He does smoke a half a pack of cigarettes per day  Past Medical History  Diagnosis Date  . Meningitis    Past Surgical History  Procedure Laterality Date  . Tendon repair    . Irrigation and debridement knee  01/02/2011    Procedure: IRRIGATION AND DEBRIDEMENT KNEE;  Surgeon: Cammy CopaGregory Scott Dean;  Location: [facility] OR;  Service: Orthopedics;  Laterality: Left;   History reviewed. No pertinent family history. Social History  Substance Use Topics  . Smoking status: Current Every Day Smoker  . Smokeless tobacco: None  . Alcohol Use: Yes    Review of Systems  Constitutional: Positive for activity change. Negative for fever, chills and fatigue.  HENT: Positive for congestion, postnasal drip, rhinorrhea and sinus pressure.   Respiratory: Positive for cough. Negative for shortness of breath, wheezing and stridor.   All other systems reviewed and are negative.   Allergies  Penicillins  Home Medications   Prior to Admission medications   Medication Sig Start Date End Date Taking? Authorizing Provider  azithromycin (ZITHROMAX Z-PAK) 250 MG tablet Use as per package instructions 05/07/15   Lutricia FeilWilliam P Lexis Potenza, PA-C  chlorpheniramine-HYDROcodone [facility](TUSSIONEX PENNKINETIC ER) 10-8 MG/5ML SUER Take 5 mLs by mouth 2 (two) times daily. 05/07/15   Lutricia FeilWilliam P Fernie Grimm, PA-C   Meds Ordered and Administered this Visit  Medications - No data to  display  BP 111/71 mmHg  Pulse 62  Temp(Src) 98 F (36.7 C) (Oral)  Resp 16  Ht 5\' 10"  (1.778 m)  Wt 204 lb (92.534 kg)  BMI 29.27 kg/m2  SpO2 99% No data found.   Physical Exam  Constitutional: He is oriented to person, place, and time. He appears well-developed and well-nourished. No distress.  HENT:  Head: Normocephalic and atraumatic.  Right Ear: External ear normal.  Left Ear: External ear normal.  Nose: Nose normal.  Mouth/Throat: Oropharynx is clear and moist. No oropharyngeal exudate.  I tenderness to percussion over the maxillary and frontal sinuses  Eyes: Conjunctivae are normal. Pupils are equal, round, and reactive to light.  Neck: Normal range of motion. Neck supple.  Pulmonary/Chest: Effort normal and breath sounds normal. No respiratory distress. He has no wheezes. He has no rales. He exhibits no tenderness.  Musculoskeletal: Normal range of motion. He exhibits no edema or tenderness.  Lymphadenopathy:    He has no cervical adenopathy.  Neurological: He is alert and oriented to person, place, and time.  Skin: Skin is warm and dry. He is not diaphoretic.  Psychiatric: He has a normal mood and affect. His behavior is normal. Judgment and thought content normal.  Nursing note and vitals reviewed.   ED Course  Procedures (including critical care time)  Labs Review Labs Reviewed - No data to display  Imaging Review No results found.   Visual Acuity Review    Right Eye Distance:   Left Eye Distance:   Bilateral Distance:    Right  Eye Near:   Left Eye Near:    Bilateral Near:         MDM   1. Acute frontal sinusitis, recurrence not specified    Discharge Medication List as of 05/07/2015 11:22 AM    START taking these medications   Details  azithromycin (ZITHROMAX Z-PAK) 250 MG tablet Use as per package instructions, Normal    chlorpheniramine-HYDROcodone (TUSSIONEX PENNKINETIC ER) 10-8 MG/5ML SUER Take 5 mLs by mouth 2 (two) times daily.,  Starting 05/07/2015, Until Discontinued, Print      Plan: 1. Test/x-ray results and diagnosis reviewed with patient 2. rx as per orders; risks, benefits, potential side effects reviewed with patient 3. Recommend supportive treatment with Fluids and rest. Start antibiotics at this time since he does have tenderness over the sinuses and he's had done this for a week and a half. Also given him Tussionex for coughing at nighttime lower him to get improved rest 4. F/u prn if symptoms worsen or don't improve     Lutricia Feil, PA-C 05/07/15 1135

## 2015-05-07 NOTE — Discharge Instructions (Signed)

## 2015-05-07 NOTE — ED Notes (Signed)
Patient c/o coughing, body aches, and sinus congestion/pressure which all started about a week and a half ago.  Denies fever/c/n/v or chest pain.

## 2015-07-13 ENCOUNTER — Ambulatory Visit
Admission: EM | Admit: 2015-07-13 | Discharge: 2015-07-13 | Disposition: A | Discharge status: Home / Self Care | Payer: Managed Care, Other (non HMO) | Attending: Family Medicine | Admitting: Family Medicine

## 2015-07-13 ENCOUNTER — Encounter: Payer: Self-pay | Admitting: Emergency Medicine

## 2015-07-13 DIAGNOSIS — J0101 Acute recurrent maxillary sinusitis: Secondary | ICD-10-CM

## 2015-07-13 DIAGNOSIS — J301 Allergic rhinitis due to pollen: Secondary | ICD-10-CM | POA: Diagnosis not present

## 2015-07-13 DIAGNOSIS — J209 Acute bronchitis, unspecified: Secondary | ICD-10-CM | POA: Diagnosis not present

## 2015-07-13 DIAGNOSIS — H6593 Unspecified nonsuppurative otitis media, bilateral: Secondary | ICD-10-CM

## 2015-07-13 MED ORDER — PSEUDOEPHEDRINE HCL 30 MG PO TABS: 30.0000 mg | ORAL_TABLET | Freq: Four times a day (QID) | ORAL | Status: AC | PRN

## 2015-07-13 MED ORDER — HYDROCOD POLST-CPM POLST ER 10-8 MG/5ML PO SUER: 5.0000 mL | Freq: Two times a day (BID) | ORAL | Status: AC | PRN

## 2015-07-13 MED ORDER — SALINE SPRAY 0.65 % NA SOLN: 2.0000 | NASAL | Status: DC

## 2015-07-13 MED ORDER — NAPROXEN SODIUM 220 MG PO CAPS: 500.0000 mg | ORAL_CAPSULE | Freq: Two times a day (BID) | ORAL | Status: AC | PRN

## 2015-07-13 MED ORDER — PREDNISONE 20 MG PO TABS: 40.0000 mg | ORAL_TABLET | Freq: Every day | ORAL | Status: DC

## 2015-07-13 MED ORDER — DOXYCYCLINE HYCLATE 100 MG PO CAPS: 100.0000 mg | ORAL_CAPSULE | Freq: Two times a day (BID) | ORAL | Status: DC

## 2015-07-13 MED ORDER — ALBUTEROL SULFATE HFA 108 (90 BASE) MCG/ACT IN AERS: 1.0000 | INHALATION_SPRAY | Freq: Four times a day (QID) | RESPIRATORY_TRACT | Status: DC | PRN
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
## 2015-07-13 NOTE — Discharge Instructions (Signed)
Acute Bronchitis Bronchitis is inflammation of the airways that extend from the windpipe into the lungs (bronchi). The inflammation often causes mucus to develop. This leads to a cough, which is the most common symptom of bronchitis.  In acute bronchitis, the condition usually develops suddenly and goes away over time, usually in a couple weeks. Smoking, allergies, and asthma can make bronchitis worse. Repeated episodes of bronchitis may cause further lung problems.  CAUSES Acute bronchitis is most often caused by the same virus that causes a cold. The virus can spread from person to person (contagious) through coughing, sneezing, and touching contaminated objects. SIGNS AND SYMPTOMS   Cough.   Fever.   Coughing up mucus.   Body aches.   Chest congestion.   Chills.   Shortness of breath.   Sore throat.  DIAGNOSIS  Acute bronchitis is usually diagnosed through a physical exam. Your health care provider will also ask you questions about your medical history. Tests, such as chest X-rays, are sometimes done to rule out other conditions.  TREATMENT  Acute bronchitis usually goes away in a couple weeks. Oftentimes, no medical treatment is necessary. Medicines are sometimes given for relief of fever or cough. Antibiotic medicines are usually not needed but may be prescribed in certain situations. In some cases, an inhaler may be recommended to help reduce shortness of breath and control the cough. A cool mist vaporizer may also be used to help thin bronchial secretions and make it easier to clear the chest.  HOME CARE INSTRUCTIONS  Get plenty of rest.   Drink enough fluids to keep your urine clear or pale yellow (unless you have a medical condition that requires fluid restriction). Increasing fluids may help thin your respiratory secretions (sputum) and reduce chest congestion, and it will prevent dehydration.   Take medicines only as directed by your health care provider.  If  you were prescribed an antibiotic medicine, finish it all even if you start to feel better.  Avoid smoking and secondhand smoke. Exposure to cigarette smoke or irritating chemicals will make bronchitis worse. If you are a smoker, consider using nicotine gum or skin patches to help control withdrawal symptoms. Quitting smoking will help your lungs heal faster.   Reduce the chances of another bout of acute bronchitis by washing your hands frequently, avoiding people with cold symptoms, and trying not to touch your hands to your mouth, nose, or eyes.   Keep all follow-up visits as directed by your health care provider.  SEEK MEDICAL CARE IF: Your symptoms do not improve after 1 week of treatment.  SEEK IMMEDIATE MEDICAL CARE IF:  You develop an increased fever or chills.   You have chest pain.   You have severe shortness of breath.  You have bloody sputum.   You develop dehydration.  You faint or repeatedly feel like you are going to pass out.  You develop repeated vomiting.  You develop a severe headache. MAKE SURE YOU:   Understand these instructions.  Will watch your condition.  Will get help right away if you are not doing well or get worse.   This information is not intended to replace advice given to you by your health care provider. Make sure you discuss any questions you have with your health care provider.   Document Released: 03/17/2004 Document Revised: 02/28/2014 Document Reviewed: 07/31/2012 Elsevier Interactive Patient Education 2016 [employer].  Allergic Rhinitis Allergic rhinitis is when the mucous membranes in the nose respond to allergens. Allergens are particles  in the air that cause your body to have an allergic reaction. This causes you to release allergic antibodies. Through a chain of events, these eventually cause you to release histamine into the blood stream. Although meant to protect the body, it is this release of histamine that causes your  discomfort, such as frequent sneezing, congestion, and an itchy, runny nose.  CAUSES Seasonal allergic rhinitis (hay fever) is caused by pollen allergens that may come from grasses, trees, and weeds. Year-round allergic rhinitis (perennial allergic rhinitis) is caused by allergens such as house dust mites, pet dander, and mold spores. SYMPTOMS  Nasal stuffiness (congestion).  Itchy, runny nose with sneezing and tearing of the eyes. DIAGNOSIS Your health care provider can help you determine the allergen or allergens that trigger your symptoms. If you and your health care provider are unable to determine the allergen, skin or blood testing may be used. Your health care provider will diagnose your condition after taking your health history and performing a physical exam. Your health care provider may assess you for other related conditions, such as asthma, pink eye, or an ear infection. TREATMENT Allergic rhinitis does not have a cure, but it can be controlled by:  Medicines that block allergy symptoms. These may include allergy shots, nasal sprays, and oral antihistamines.  Avoiding the allergen. Hay fever may often be treated with antihistamines in pill or nasal spray forms. Antihistamines block the effects of histamine. There are over-the-counter medicines that may help with nasal congestion and swelling around the eyes. Check with your health care provider before taking or giving this medicine. If avoiding the allergen or the medicine prescribed do not work, there are many new medicines your health care provider can prescribe. Stronger medicine may be used if initial measures are ineffective. Desensitizing injections can be used if medicine and avoidance does not work. Desensitization is when a patient is given ongoing shots until the body becomes less sensitive to the allergen. Make sure you follow up with your health care provider if problems continue. HOME CARE INSTRUCTIONS It is not possible  to completely avoid allergens, but you can reduce your symptoms by taking steps to limit your exposure to them. It helps to know exactly what you are allergic to so that you can avoid your specific triggers. SEEK MEDICAL CARE IF:  You have a fever.  You develop a cough that does not stop easily (persistent).  You have shortness of breath.  You start wheezing.  Symptoms interfere with normal daily activities.   This information is not intended to replace advice given to you by your health care provider. Make sure you discuss any questions you have with your health care provider.   Document Released: 11/02/2000 Document Revised: 02/28/2014 Document Reviewed: 10/15/2012 Elsevier Interactive Patient Education 2016 [employer]. Otitis Media With Effusion Otitis media with effusion is the presence of fluid in the middle ear. This is a common problem in children, which often follows ear infections. It may be present for weeks or longer after the infection. Unlike an acute ear infection, otitis media with effusion refers only to fluid behind the ear drum and not infection. Children with repeated ear and sinus infections and allergy problems are the most likely to get otitis media with effusion. CAUSES  The most frequent cause of the fluid buildup is dysfunction of the eustachian tubes. These are the tubes that drain fluid in the ears to the back of the nose (nasopharynx). SYMPTOMS   The main symptom of  this condition is hearing loss. As a result, you or your child may:  Listen to the TV at a loud volume.  Not respond to questions.  Ask "what" often when spoken to.  Mistake or confuse one sound or word for another.  There may be a sensation of fullness or pressure but usually not pain. DIAGNOSIS   Your health care provider will diagnose this condition by examining you or your child's ears.  Your health care provider may test the pressure in you or your child's ear with a  tympanometer.  A hearing test may be conducted if the problem persists. TREATMENT   Treatment depends on the duration and the effects of the effusion.  Antibiotics, decongestants, nose drops, and cortisone-type drugs (tablets or nasal spray) may not be helpful.  Children with persistent ear effusions may have delayed language or behavioral problems. Children at risk for developmental delays in hearing, learning, and speech may require referral to a specialist earlier than children not at risk.  You or your child's health care provider may suggest a referral to an ear, nose, and throat surgeon for treatment. The following may help restore normal hearing:  Drainage of fluid.  Placement of ear tubes (tympanostomy tubes).  Removal of adenoids (adenoidectomy). HOME CARE INSTRUCTIONS   Avoid secondhand smoke.  Infants who are breastfed are less likely to have this condition.  Avoid feeding infants while they are lying flat.  Avoid known environmental allergens.  Avoid people who are sick. SEEK MEDICAL CARE IF:   Hearing is not better in 3 months.  Hearing is worse.  Ear pain.  Drainage from the ear.  Dizziness. MAKE SURE YOU:   Understand these instructions.  Will watch your condition.  Will get help right away if you are not doing well or get worse.   This information is not intended to replace advice given to you by your health care provider. Make sure you discuss any questions you have with your health care provider.   Document Released: 03/17/2004 Document Revised: 02/28/2014 Document Reviewed: 09/04/2012 Elsevier Interactive Patient Education 2016 [employer] Inc[employer] Sinusitis, Adult Sinusitis is redness, soreness, and inflammation of the paranasal sinuses. Paranasal sinuses are air pockets within the bones of your face. They are located beneath your eyes, in the middle of your forehead, and above your eyes. In healthy paranasal sinuses, mucus is able to drain out, and  air is able to circulate through them by way of your nose. However, when your paranasal sinuses are inflamed, mucus and air can become trapped. This can allow bacteria and other germs to grow and cause infection. Sinusitis can develop quickly and last only a short time (acute) or continue over a long period (chronic). Sinusitis that lasts for more than 12 weeks is considered chronic. CAUSES Causes of sinusitis include:  Allergies.  Structural abnormalities, such as displacement of the cartilage that separates your nostrils (deviated septum), which can decrease the air flow through your nose and sinuses and affect sinus drainage.  Functional abnormalities, such as when the small hairs (cilia) that line your sinuses and help remove mucus do not work properly or are not present. SIGNS AND SYMPTOMS Symptoms of acute and chronic sinusitis are the same. The primary symptoms are pain and pressure around the affected sinuses. Other symptoms include:  Upper toothache.  Earache.  Headache.  Bad breath.  Decreased sense of smell and taste.  A cough, which worsens when you are lying flat.  Fatigue.  Fever.  Thick drainage  from your nose, which often is green and may contain pus (purulent).  Swelling and warmth over the affected sinuses. DIAGNOSIS Your health care provider will perform a physical exam. During your exam, your health care provider may perform any of the following to help determine if you have acute sinusitis or chronic sinusitis:  Look in your nose for signs of abnormal growths in your nostrils (nasal polyps).  Tap over the affected sinus to check for signs of infection.  View the inside of your sinuses using an imaging device that has a light attached (endoscope). If your health care provider suspects that you have chronic sinusitis, one or more of the following tests may be recommended:  Allergy tests.  Nasal culture. A sample of mucus is taken from your nose, sent to  a lab, and screened for bacteria.  Nasal cytology. A sample of mucus is taken from your nose and examined by your health care provider to determine if your sinusitis is related to an allergy. TREATMENT Most cases of acute sinusitis are related to a viral infection and will resolve on their own within 10 days. Sometimes, medicines are prescribed to help relieve symptoms of both acute and chronic sinusitis. These may include pain medicines, decongestants, nasal steroid sprays, or saline sprays. However, for sinusitis related to a bacterial infection, your health care provider will prescribe antibiotic medicines. These are medicines that will help kill the bacteria causing the infection. Rarely, sinusitis is caused by a fungal infection. In these cases, your health care provider will prescribe antifungal medicine. For some cases of chronic sinusitis, surgery is needed. Generally, these are cases in which sinusitis recurs more than 3 times per year, despite other treatments. HOME CARE INSTRUCTIONS  Drink plenty of water. Water helps thin the mucus so your sinuses can drain more easily.  Use a humidifier.  Inhale steam 3-4 times a day (for example, sit in the bathroom with the shower running).  Apply a warm, moist washcloth to your face 3-4 times a day, or as directed by your health care provider.  Use saline nasal sprays to help moisten and clean your sinuses.  Take medicines only as directed by your health care provider.  If you were prescribed either an antibiotic or antifungal medicine, finish it all even if you start to feel better. SEEK IMMEDIATE MEDICAL CARE IF:  You have increasing pain or severe headaches.  You have nausea, vomiting, or drowsiness.  You have swelling around your face.  You have vision problems.  You have a stiff neck.  You have difficulty breathing.   This information is not intended to replace advice given to you by your health care provider. Make sure you  discuss any questions you have with your health care provider.   Document Released: 02/07/2005 Document Revised: 02/28/2014 Document Reviewed: 02/22/2011 Elsevier Interactive Patient Education [employer]

## 2015-07-13 NOTE — ED Notes (Signed)
Patient c/o sinus congestion, cough, chest congestion, runny nose for over a week.  Patient denies fevers.

## 2015-08-30 ENCOUNTER — Ambulatory Visit (INDEPENDENT_AMBULATORY_CARE_PROVIDER_SITE_OTHER): Payer: Managed Care, Other (non HMO)

## 2015-08-30 ENCOUNTER — Ambulatory Visit
Admission: EM | Admit: 2015-08-30 | Discharge: 2015-08-30 | Disposition: A | Discharge status: Home / Self Care | Payer: Managed Care, Other (non HMO) | Attending: Family Medicine | Admitting: Family Medicine

## 2015-08-30 ENCOUNTER — Encounter: Payer: Self-pay | Admitting: *Deleted

## 2015-08-30 ENCOUNTER — Ambulatory Visit: Payer: Managed Care, Other (non HMO)

## 2015-08-30 DIAGNOSIS — M25469 Effusion, unspecified knee: Secondary | ICD-10-CM

## 2015-08-30 DIAGNOSIS — M25361 Other instability, right knee: Secondary | ICD-10-CM | POA: Diagnosis not present

## 2015-08-30 DIAGNOSIS — R52 Pain, unspecified: Secondary | ICD-10-CM

## 2015-08-30 MED ORDER — NAPROXEN 500 MG PO TABS: 500.0000 mg | ORAL_TABLET | Freq: Two times a day (BID) | ORAL | Status: DC

## 2015-08-30 NOTE — ED Notes (Signed)
While playing disc golf last Tuesday, pt had right foot planted to throw disc and as he twisted body to make throw he felt a "pop" in his right knee with immediate pain. Has been icing, elevating, and taking ibuprofen without relief. Right knee appears mild edema, no deformity.

## 2015-08-30 NOTE — ED Provider Notes (Addendum)
CSN: [id number]     Arrival date & time 08/30/15  1403 History   First MD Initiated Contact with Patient 08/30/15 1446     Chief Complaint  Patient presents with  . Knee Pain   (Consider location/radiation/quality/duration/timing/severity/associated sxs/prior Treatment) HPI: Patient presents today with symptoms of right knee pain. Patient states that he was playing golf last Tuesday and planted his foot and twisted his body when he heard a pop to his right knee. He has been icing and elevating the knee and taking Ibuprofen. He denies any previous injury to the right knee in the past. He does feel some instability to the knee. No other injury anywhere else.  Past Medical History  Diagnosis Date  . Meningitis    Past Surgical History  Procedure Laterality Date  . Tendon repair    . Irrigation and debridement knee  01/02/2011    Procedure: IRRIGATION AND DEBRIDEMENT KNEE;  Surgeon: Cammy CopaGregory Scott Dean;  Location: [facility] OR;  Service: Orthopedics;  Laterality: Left;   History reviewed. No pertinent family history. Social History  Substance Use Topics  . Smoking status: Current Some Day Smoker -- 0.50 packs/day    Types: Cigarettes  . Smokeless tobacco: None  . Alcohol Use: No    Review of Systems: Negative except mentioned above.  Allergies  Penicillins and Vicodin  Home Medications   Prior to Admission medications   Medication Sig Start Date End Date Taking? Authorizing Provider  albuterol (PROVENTIL HFA;VENTOLIN HFA) 108 (90 Base) MCG/ACT inhaler Inhale 1-2 puffs into the lungs every 6 (six) hours as needed for wheezing or shortness of breath. 07/13/15   Barbaraann Barthelina A Betancourt, NP  cetirizine (ZYRTEC) 10 MG tablet Take 10 mg by mouth daily.    Historical Provider, MD  doxycycline (VIBRAMYCIN) 100 MG capsule Take 1 capsule (100 mg total) by mouth 2 (two) times daily. 07/13/15   Barbaraann Barthelina A Betancourt, NP  predniSONE (DELTASONE) 20 MG tablet Take 2 tablets (40 mg total) by mouth daily with  breakfast. 07/13/15   Barbaraann Barthelina A Betancourt, NP  sodium chloride (OCEAN) 0.65 % SOLN nasal spray Place 2 sprays into both nostrils every 2 (two) hours while awake. 07/13/15   Barbaraann Barthelina A Betancourt, NP   Meds Ordered and Administered this Visit  Medications - No data to display  BP 117/79 mmHg  Pulse 76  Temp(Src) 98.3 F (36.8 C) (Oral)  Resp 16  Ht 5\' 10"  (1.778 m)  Wt 185 lb (83.915 kg)  BMI 26.54 kg/m2  SpO2 99% No data found.   Physical Exam:  GENERAL: NAD HEENT: no pharyngeal erythema, no exudate, no erythema of TMs, no cervical LAD RESP: CTA B CARD: RRR MSK: R Knee: + mild effusion, decreased flexion due to swelling and pain, medial jointline tenderness, tenative with Lachman testing, unable to get him to relax, no significant laxity appreciated with varus/valgus stress, nv intact  NEURO: CN II-XII grossly intact   ED Course  Procedures (including critical care time)  Labs Review Labs Reviewed - No data to display  Imaging Review No results found.   MDM  A/P: R Knee Noncontact Injury/Swelling- X-rays done, recommend doing an MRI to evaluate for ligament/meniscus injury, will try to order this through our facility as an outpatient, ice when necessary, elevation, crutches when necessary, patient has compression sleeve, once MRI results reviewed patient can follow-up with orthopedics.    Jolene ProvostKirtida Dylynn Ketner, MD 08/30/15 [phone]  Jolene ProvostKirtida Charlette Hennings, MD 08/31/15 1134

## 2015-09-01 DIAGNOSIS — S8991XA Unspecified injury of right lower leg, initial encounter: Secondary | ICD-10-CM | POA: Insufficient documentation

## 2015-09-02 ENCOUNTER — Other Ambulatory Visit: Payer: Self-pay | Admitting: Orthopedic Surgery

## 2015-09-02 DIAGNOSIS — R609 Edema, unspecified: Secondary | ICD-10-CM

## 2015-09-02 DIAGNOSIS — M25561 Pain in right knee: Secondary | ICD-10-CM

## 2015-09-03 ENCOUNTER — Ambulatory Visit
Admission: RE | Admit: 2015-09-03 | Discharge: 2015-09-03 | Disposition: A | Discharge status: Home / Self Care | Payer: Managed Care, Other (non HMO) | Source: Ambulatory Visit | Attending: Orthopedic Surgery | Admitting: Orthopedic Surgery

## 2015-09-03 DIAGNOSIS — M25561 Pain in right knee: Secondary | ICD-10-CM

## 2015-09-03 DIAGNOSIS — R609 Edema, unspecified: Secondary | ICD-10-CM

## 2015-09-15 DIAGNOSIS — Z9889 Other specified postprocedural states: Secondary | ICD-10-CM | POA: Insufficient documentation

## 2017-11-07 ENCOUNTER — Ambulatory Visit
Admission: EM | Admit: 2017-11-07 | Discharge: 2017-11-07 | Disposition: A | Discharge status: Home / Self Care | Payer: Self-pay | Attending: Family Medicine | Admitting: Family Medicine

## 2017-11-07 ENCOUNTER — Encounter: Payer: Self-pay | Admitting: Emergency Medicine

## 2017-11-07 ENCOUNTER — Other Ambulatory Visit: Payer: Self-pay

## 2017-11-07 ENCOUNTER — Ambulatory Visit (INDEPENDENT_AMBULATORY_CARE_PROVIDER_SITE_OTHER): Payer: Self-pay

## 2017-11-07 DIAGNOSIS — M25562 Pain in left knee: Secondary | ICD-10-CM

## 2017-11-07 MED ORDER — MELOXICAM 15 MG PO TABS: 15.0000 mg | ORAL_TABLET | Freq: Every day | ORAL | 0 refills | Status: DC | PRN

## 2017-11-07 MED ORDER — PREDNISONE 10 MG (21) PO TBPK: ORAL_TABLET | ORAL | 0 refills | Status: DC

## 2017-11-07 NOTE — ED Triage Notes (Signed)
Pt c/o left knee pain. Pt has knee pain all the time but recent has gotten worse in the last few days. He thinks he has fluid on his knee. He has had multiple surgeries on his knees.

## 2017-11-07 NOTE — Discharge Instructions (Addendum)
Rest, ice.  Medication as prescribed.  Follow up with Ortho.  Take care  Dr. Adriana Simasook

## 2017-11-07 NOTE — ED Provider Notes (Signed)
[facility]    CSN: [id number] Arrival date & time: 11/07/17  7829     History   Chief Complaint Chief Complaint  Patient presents with  . Knee Pain    left    HPI  42 year old male presents with left knee pain.  1 week history of left knee pain.  Has been worsening.  He had a prior injury to this knee.  No recent fall, trauma, injury.  Worse with activity.  No relieving factors.  No medications or interventions tried.  He reports mild swelling.  No bruising.  No other associated symptoms.  No other complaints/concerns at this time.   PMH, Surgical Hx, Family Hx, Social History reviewed and updated as below.  Past Medical History:  Diagnosis Date  . Meningitis    Past Surgical History:  Procedure Laterality Date  . IRRIGATION AND DEBRIDEMENT KNEE  01/02/2011   Procedure: IRRIGATION AND DEBRIDEMENT KNEE;  Surgeon: Cammy Copa;  Location: [facility] OR;  Service: Orthopedics;  Laterality: Left;  . TENDON REPAIR      Family History Family History  Problem Relation Age of Onset  . Hypertension Father     Social History Social History   Tobacco Use  . Smoking status: Current Every Day Smoker    Packs/day: 0.50    Types: Cigarettes  . Smokeless tobacco: Never Used  Substance Use Topics  . Alcohol use: Not Currently    Comment: "sober for 3 years"  . Drug use: No     Allergies   Penicillins and Vicodin [hydrocodone-acetaminophen]   Review of Systems Review of Systems  Constitutional: Negative.   Musculoskeletal:       Left knee pain.   Physical Exam Triage Vital Signs ED Triage Vitals  Enc Vitals Group     BP 11/07/17 1010 129/79     Pulse Rate 11/07/17 1010 74     Resp 11/07/17 1010 16     Temp 11/07/17 1010 98.1 F (36.7 C)     Temp Source 11/07/17 1010 Oral     SpO2 11/07/17 1010 100 %     Weight 11/07/17 1009 202 lb (91.6 kg)     Height 11/07/17 1009 5\' 10"  (1.778 m)     Head Circumference --      Peak Flow --      Pain Score  11/07/17 1008 8     Pain Loc --      Pain Edu? --      Excl. in GC? --    Updated Vital Signs BP 129/79 (BP Location: Left Arm)   Pulse 74   Temp 98.1 F (36.7 C) (Oral)   Resp 16   Ht 5\' 10"  (1.778 m)   Wt 91.6 kg   SpO2 100%   BMI 28.98 kg/m   Visual Acuity Right Eye Distance:   Left Eye Distance:   Bilateral Distance:    Right Eye Near:   Left Eye Near:    Bilateral Near:     Physical Exam  Constitutional: He is oriented to person, place, and time. He appears well-developed. No distress.  HENT:  Head: Normocephalic and atraumatic.  Pulmonary/Chest: Effort normal. No respiratory distress.  Musculoskeletal:  Left knee -possible small effusion.  Ligaments intact.  No discrete areas of tenderness.  Neurological: He is alert and oriented to person, place, and time.  Psychiatric: He has a normal mood and affect. His behavior is normal.  Nursing note and vitals reviewed.  UC Treatments / Results  Labs (all labs ordered are listed, but only abnormal results are displayed) Labs Reviewed - No data to display  EKG None  Radiology Dg Knee Complete 4 Views Left  Result Date: 11/07/2017 CLINICAL DATA:  42 year old male with knee pain for the past week. No recent injury. Injury 6 years ago. Initial encounter. EXAM: LEFT KNEE - COMPLETE 4+ VIEW COMPARISON:  01/02/2011. FINDINGS: No fracture or dislocation. Minimal patellofemoral joint degenerative changes. Spur superior patella/quadriceps tendon insertion site. No evidence of joint effusion. IMPRESSION: Minimal patellofemoral joint degenerative changes. Spur superior patella/quadriceps tendon insertion site. Electronically Signed   By: Lacy DuverneySteven  Olson M.D.   On: 11/07/2017 11:09    Procedures Procedures (including critical care time)  Medications Ordered in UC Medications - No data to display  Initial Impression / Assessment and Plan / UC Course  I have reviewed the triage vital signs and the nursing notes.  Pertinent  labs & imaging results that were available during my care of the patient were reviewed by me and considered in my medical decision making (see chart for details).    42 year old male presents with acute knee pain.  Try revealed mild patellofemoral degenerative changes.  Meloxicam as needed.  Follow-up with orthopedics.  Final Clinical Impressions(s) / UC Diagnoses   Final diagnoses:  Acute pain of left knee     Discharge Instructions     Rest, ice.  Medication as prescribed.  Follow up with Ortho.  Take care  Dr. Adriana Simasook    ED Prescriptions    Medication Sig Dispense Auth. Provider   predniSONE (STERAPRED UNI-PAK 21 TAB) 10 MG (21) TBPK tablet  (Status: Discontinued) 6 tablets on day 1; decrease by 1 tablet daily until gone. 21 tablet Abdulloh Ullom G, DO   meloxicam (MOBIC) 15 MG tablet Take 1 tablet (15 mg total) by mouth daily as needed. 30 tablet Tommie Samsook, Jamen Loiseau G, DO     Controlled Substance Prescriptions [place] Controlled Substance Registry consulted? Not Applicable   Tommie SamsCook, Jasun Gasparini G, DO 11/07/17 1201

## 2018-12-18 ENCOUNTER — Other Ambulatory Visit: Payer: Self-pay | Admitting: *Deleted

## 2018-12-18 DIAGNOSIS — Z20822 Contact with and (suspected) exposure to covid-19: Secondary | ICD-10-CM

## 2018-12-19 LAB — NOVEL CORONAVIRUS, NAA: SARS-CoV-2, NAA: NOT DETECTED

## 2018-12-31 ENCOUNTER — Emergency Department: Payer: Self-pay

## 2018-12-31 ENCOUNTER — Encounter: Payer: Self-pay | Admitting: Emergency Medicine

## 2018-12-31 ENCOUNTER — Emergency Department
Admission: EM | Admit: 2018-12-31 | Discharge: 2018-12-31 | Disposition: A | Discharge status: Home / Self Care | Payer: Self-pay | Attending: Emergency Medicine | Admitting: Emergency Medicine

## 2018-12-31 ENCOUNTER — Other Ambulatory Visit: Payer: Self-pay

## 2018-12-31 DIAGNOSIS — Y999 Unspecified external cause status: Secondary | ICD-10-CM | POA: Insufficient documentation

## 2018-12-31 DIAGNOSIS — R109 Unspecified abdominal pain: Secondary | ICD-10-CM | POA: Insufficient documentation

## 2018-12-31 DIAGNOSIS — Y939 Activity, unspecified: Secondary | ICD-10-CM | POA: Insufficient documentation

## 2018-12-31 DIAGNOSIS — F17210 Nicotine dependence, cigarettes, uncomplicated: Secondary | ICD-10-CM | POA: Insufficient documentation

## 2018-12-31 DIAGNOSIS — Y33XXXA Other specified events, undetermined intent, initial encounter: Secondary | ICD-10-CM | POA: Insufficient documentation

## 2018-12-31 DIAGNOSIS — S39012A Strain of muscle, fascia and tendon of lower back, initial encounter: Secondary | ICD-10-CM | POA: Insufficient documentation

## 2018-12-31 DIAGNOSIS — Y929 Unspecified place or not applicable: Secondary | ICD-10-CM | POA: Insufficient documentation

## 2018-12-31 LAB — COMPREHENSIVE METABOLIC PANEL
ALT: 46 U/L — ABNORMAL HIGH (ref 0–44)
AST: 21 U/L (ref 15–41)
Albumin: 4.8 g/dL (ref 3.5–5.0)
Alkaline Phosphatase: 64 U/L (ref 38–126)
Anion gap: 9 (ref 5–15)
BUN: 13 mg/dL (ref 6–20)
CO2: 24 mmol/L (ref 22–32)
Calcium: 9.5 mg/dL (ref 8.9–10.3)
Chloride: 108 mmol/L (ref 98–111)
Creatinine, Ser: 0.82 mg/dL (ref 0.61–1.24)
GFR calc Af Amer: >60 mL/min (ref >60)
GFR calc non Af Amer: >60 mL/min (ref >60)
Glucose, Bld: 100 mg/dL — ABNORMAL HIGH (ref 70–99)
Potassium: 4.1 mmol/L (ref 3.5–5.1)
Sodium: 141 mmol/L (ref 135–145)
Total Bilirubin: 0.6 mg/dL (ref 0.3–1.2)
Total Protein: 7.8 g/dL (ref 6.5–8.1)

## 2018-12-31 LAB — URINALYSIS, COMPLETE (UACMP) WITH MICROSCOPIC
Bacteria, UA: NONE SEEN
Bilirubin Urine: NEGATIVE
Glucose, UA: NEGATIVE mg/dL
Hgb urine dipstick: NEGATIVE
Ketones, ur: 5 mg/dL — AB
Leukocytes,Ua: NEGATIVE
Nitrite: NEGATIVE
Protein, ur: NEGATIVE mg/dL
Specific Gravity, Urine: 1.024 (ref 1.005–1.030)
pH: 5 (ref 5.0–8.0)

## 2018-12-31 LAB — CBC
HCT: 43 % (ref 39.0–52.0)
Hemoglobin: 14.6 g/dL (ref 13.0–17.0)
MCH: 31.9 pg (ref 26.0–34.0)
MCHC: 34 g/dL (ref 30.0–36.0)
MCV: 94.1 fL (ref 80.0–100.0)
Platelets: 247 10*3/uL (ref 150–400)
RBC: 4.57 MIL/uL (ref 4.22–5.81)
RDW: 13.2 % (ref 11.5–15.5)
WBC: 7.2 10*3/uL (ref 4.0–10.5)
nRBC: 0 % (ref 0.0–0.2)

## 2018-12-31 LAB — LIPASE, BLOOD: Lipase: 25 U/L (ref 11–51)

## 2018-12-31 MED ORDER — PREDNISONE 50 MG PO TABS: 50.0000 mg | ORAL_TABLET | Freq: Every day | ORAL | 0 refills | Status: DC

## 2018-12-31 MED ORDER — NAPROXEN 500 MG PO TABS: 500.0000 mg | ORAL_TABLET | Freq: Two times a day (BID) | ORAL | 2 refills | Status: DC

## 2018-12-31 MED ORDER — KETOROLAC TROMETHAMINE 30 MG/ML IJ SOLN
30.0000 mg | Freq: Once | INTRAMUSCULAR | Status: AC
  Administered 2018-12-31: 30 mg via INTRAMUSCULAR
  Filled 2018-12-31: qty 1

## 2018-12-31 NOTE — ED Provider Notes (Signed)
[facility] Emergency Department Provider Note   ____________________________________________    I have reviewed the triage vital signs and the nursing notes.   HISTORY  Chief Complaint     HPI Tony Galvan is a 43 y.o. male who presents with low back pain which radiates around his flanks bilaterally.  Patient reports this started 2 days ago and became acutely worse this morning.  He reports he had difficulty putting on his socks today because of pain.  Does report that in the past he was told that he had a kidney stone in one of his kidneys but has never actually passed one.  Denies fevers or chills.  No weakness numbness.  Has not take anything for this.   Past Medical History:  Diagnosis Date  . Meningitis     There are no active problems to display for this patient.   Past Surgical History:  Procedure Laterality Date  . IRRIGATION AND DEBRIDEMENT KNEE  01/02/2011   Procedure: IRRIGATION AND DEBRIDEMENT KNEE;  Surgeon: Meredith Pel;  Location: [place];  Service: Orthopedics;  Laterality: Left;  . TENDON REPAIR      Prior to Admission medications   Medication Sig Start Date End Date Taking? Authorizing Provider  cetirizine (ZYRTEC) 10 MG tablet Take 10 mg by mouth daily.    [provider]  naproxen (NAPROSYN) 500 MG tablet Take 1 tablet (500 mg total) by mouth 2 (two) times daily with a meal. 12/31/18   Lavonia Drafts, MD  predniSONE (DELTASONE) 50 MG tablet Take 1 tablet (50 mg total) by mouth daily with breakfast. 12/31/18   Lavonia Drafts, MD     Allergies Penicillins and Vicodin [hydrocodone-acetaminophen]  Family History  Problem Relation Age of Onset  . Hypertension Father     Social History Social History   Tobacco Use  . Smoking status: Current Every Day Smoker    Packs/day: 0.50    Types: Cigarettes  . Smokeless tobacco: Never Used  Substance Use Topics  . Alcohol use: Not Currently    Comment:  "sober for 3 years"  . Drug use: No    Review of Systems  Constitutional: No fever/chills Eyes: No visual changes.  ENT: No sore throat. Cardiovascular: Denies chest pain. Respiratory: Denies shortness of breath. Gastrointestinal: As above Genitourinary: Negative for dysuria. Musculoskeletal: As above Skin: Negative for rash. Neurological: No numbness or weakness   ____________________________________________   PHYSICAL EXAM:  VITAL SIGNS: ED Triage Vitals  Enc Vitals Group     BP 12/31/18 1057 115/69     Pulse Rate 12/31/18 1057 67     Resp 12/31/18 1057 20     Temp 12/31/18 1057 98.1 F (36.7 C)     Temp Source 12/31/18 1057 Oral     SpO2 12/31/18 1057 99 %     Weight 12/31/18 1055 93 kg (205 lb)     Height 12/31/18 1055 1.778 m (5\' 10" )     Head Circumference --      Peak Flow --      Pain Score 12/31/18 1054 10     Pain Loc --      Pain Edu? --      Excl. in [place]? --     Constitutional: Alert and oriented.  Nose: No congestion/rhinnorhea. Mouth/Throat: Mucous membranes are moist.   Neck:  Painless ROM Cardiovascular: Normal rate, regular rhythm. Grossly normal heart sounds.  Good peripheral circulation. Respiratory: Normal respiratory effort.  No retractions. Lungs  CTAB. Gastrointestinal: Soft and nontender. No distention.  No CVA tenderness. Genitourinary: deferred Musculoskeletal: Back: No vertebral chest palpation, mild paraspinal muscle tenderness bilaterally, lumbar.  Normal strength in the lower extremities, no saddle anesthesia. Neurologic:  Normal speech and language. No gross focal neurologic deficits are appreciated.  Skin:  Skin is warm, dry and intact. No rash noted. Psychiatric: Mood and affect are normal. Speech and behavior are normal.  ____________________________________________   LABS (all labs ordered are listed, but only abnormal results are displayed)  Labs Reviewed  COMPREHENSIVE METABOLIC PANEL - Abnormal; Notable for the  following components:      Result Value   Glucose, Bld 100 (*)    ALT 46 (*)    All other components within normal limits  URINALYSIS, COMPLETE (UACMP) WITH MICROSCOPIC - Abnormal; Notable for the following components:   Color, Urine YELLOW (*)    APPearance CLEAR (*)    Ketones, ur 5 (*)    All other components within normal limits  LIPASE, BLOOD  CBC   ____________________________________________  EKG  None ____________________________________________  RADIOLOGY  CT renal stone study unremarkable ____________________________________________   PROCEDURES  Procedure(s) performed: No  Procedures   Critical Care performed: No ____________________________________________   INITIAL IMPRESSION / ASSESSMENT AND PLAN / ED COURSE  Pertinent labs & imaging results that were available during my care of the patient were reviewed by me and considered in my medical decision making (see chart for details).  Patient presents with bilateral back pain suspicious for lumbar strain however the patient is concerned that he may have a kidney stone and the pain does wrap around his flank on the right primarily.  Treated with IM Toradol with significant improvement, CT renal stone study negative for kidney stone, urinalysis lab work unremarkable.  Recommend supportive care, outpatient follow-up.    ____________________________________________   FINAL CLINICAL IMPRESSION(S) / ED DIAGNOSES  Final diagnoses:  Strain of lumbar region, initial encounter        Note:  This document was prepared using Dragon voice recognition software and may include unintentional dictation errors.   Jene Every, MD 12/31/18 1431

## 2018-12-31 NOTE — ED Notes (Signed)
See triage note  Presents with lower back pain  States pain radiates into lower abd   Pain started couple of days ago  Denies any fever or n/v

## 2018-12-31 NOTE — ED Triage Notes (Signed)
Pt reports Saturday started with pain across his lower back and lower abd. Pt reports some trouble urinating this am and some nausea.

## 2019-03-06 ENCOUNTER — Ambulatory Visit: Payer: Self-pay | Attending: Internal Medicine

## 2019-03-06 DIAGNOSIS — Z20822 Contact with and (suspected) exposure to covid-19: Secondary | ICD-10-CM

## 2019-03-06 DIAGNOSIS — U071 COVID-19: Secondary | ICD-10-CM | POA: Insufficient documentation

## 2019-03-07 LAB — NOVEL CORONAVIRUS, NAA: SARS-CoV-2, NAA: DETECTED — AB

## 2021-07-22 ENCOUNTER — Ambulatory Visit
Admission: EM | Admit: 2021-07-22 | Discharge: 2021-07-22 | Disposition: A | Discharge status: Home / Self Care | Payer: 59 | Attending: Physician Assistant | Admitting: Physician Assistant

## 2021-07-22 DIAGNOSIS — M778 Other enthesopathies, not elsewhere classified: Secondary | ICD-10-CM

## 2021-07-22 DIAGNOSIS — M25531 Pain in right wrist: Secondary | ICD-10-CM | POA: Diagnosis not present

## 2021-07-22 MED ORDER — PREDNISONE 10 MG PO TABS: ORAL_TABLET | ORAL | 0 refills | Status: DC

## 2021-07-22 NOTE — ED Provider Notes (Signed)
[facility]    CSN: [id number] Arrival date & time: 07/22/21  0803      History   Chief Complaint Chief Complaint  Patient presents with   Wrist Pain    HPI Tony Galvan is a 46 y.o. right-hand-dominant male presenting for 3-week history of intermittent right wrist pain which significantly worsened yesterday.  Patient says over the past couple weeks he has been able to ice it, use wrist brace, and take Motrin and Aleve with some improvement in symptoms.  Patient reports he is very active at his job with his hands and does perform a lot of repetitive movements with his hands and wrists.  He has no history of carpal tunnel and denies any associated numbness, weakness or tingling.  Patient reports yesterday he was working and felt something pop in his wrist and ever since then he has had significant pain when trying to make a fist or with movements of his thumb.  Most of his pain is at the ventral aspect of his wrist.  He has no associated swelling or bruising.  He has no other complaints.  HPI  Past Medical History:  Diagnosis Date   Meningitis     There are no problems to display for this patient.   Past Surgical History:  Procedure Laterality Date   IRRIGATION AND DEBRIDEMENT KNEE  01/02/2011   Procedure: IRRIGATION AND DEBRIDEMENT KNEE;  Surgeon: Cammy Copa;  Location: [facility];  Service: Orthopedics;  Laterality: Left;   TENDON REPAIR         Home Medications    Prior to Admission medications   Medication Sig Start Date End Date Taking? Authorizing Provider  predniSONE (DELTASONE) 10 MG tablet Take 6 tabs po on day 1 and decrease by 1 tab daily until complete 07/22/21  Yes Shirlee Latch, PA-C    Family History Family History  Problem Relation Age of Onset   Hypertension Father     Social History Social History   Tobacco Use   Smoking status: Former    Packs/day: 0.50    Types: Cigarettes    Quit date: 10/2020    Years since quitting:  0.7   Smokeless tobacco: Never  Vaping Use   Vaping Use: Every day  Substance Use Topics   Alcohol use: Not Currently    Comment: "sober for 7 years"   Drug use: No     Allergies   Penicillins and Vicodin [hydrocodone-acetaminophen]   Review of Systems Review of Systems  Musculoskeletal:  Positive for arthralgias. Negative for joint swelling and neck pain.  Skin:  Negative for color change.  Neurological:  Negative for weakness and numbness.    Physical Exam Triage Vital Signs ED Triage Vitals  Enc Vitals Group     BP      Pulse      Resp      Temp      Temp src      SpO2      Weight      Height      Head Circumference      Peak Flow      Pain Score      Pain Loc      Pain Edu?      Excl. in [place]?    No data found.  Updated Vital Signs BP 123/82 (BP Location: Left Arm)   Pulse 60   Temp 98.2 F (36.8 C) (Oral)   Resp 18   Ht  5\' 10"  (1.778 m)   Wt 213 lb (96.6 kg)   SpO2 98%   BMI 30.56 kg/m      Physical Exam Vitals and nursing note reviewed.  Constitutional:      General: He is not in acute distress.    Appearance: Normal appearance. He is well-developed. He is not ill-appearing.  HENT:     Head: Normocephalic and atraumatic.  Eyes:     General: No scleral icterus.    Conjunctiva/sclera: Conjunctivae normal.  Cardiovascular:     Rate and Rhythm: Normal rate.     Pulses: Normal pulses.  Pulmonary:     Effort: Pulmonary effort is normal. No respiratory distress.  Musculoskeletal:     Right wrist: Tenderness (diffuse TTP ventral wrist and forearm) present. No swelling, deformity, bony tenderness or snuff box tenderness. Decreased range of motion (decreased flexion and extension of wrist due to pain). Normal pulse.     Cervical back: Neck supple.  Skin:    General: Skin is warm and dry.     Capillary Refill: Capillary refill takes less than 2 seconds.  Neurological:     General: No focal deficit present.     Mental Status: He is alert. Mental  status is at baseline.     Motor: No weakness.     Coordination: Coordination normal.     Gait: Gait normal.  Psychiatric:        Mood and Affect: Mood normal.        Behavior: Behavior normal.        Thought Content: Thought content normal.     UC Treatments / Results  Labs (all labs ordered are listed, but only abnormal results are displayed) Labs Reviewed - No data to display  EKG   Radiology No results found.  Procedures Procedures (including critical care time)  Medications Ordered in UC Medications - No data to display  Initial Impression / Assessment and Plan / UC Course  I have reviewed the triage vital signs and the nursing notes.  Pertinent labs & imaging results that were available during my care of the patient were reviewed by me and considered in my medical decision making (see chart for details).  46 year old male presenting for 3-week history of intermittent right wrist pain.  Symptoms worsened yesterday while using his hand/wrist.  Has been taking NSAIDs and using a wrist brace as well as applying ice without any improvement in symptoms since yesterday.  No numbness, weakness or tingling.  He does have radiation to motion of wrist due to pain and guarding.  No associated swelling.  He has diffuse tenderness to palpation of the ventral aspect of the wrist over the tendons.  No bony tenderness.  Good strength and sensation.  Negative Tinel sign.  Symptoms consistent with tendinitis.  Suspect overuse tendinitis.  We will treat with prednisone taper at this time.  Advised to continue with RICE guidelines.  Provided him with a thumb spica splint.  Hopefully this helps more than the plain wrist brace.  Reviewed following up with Ortho if no improvement in the next week or if symptoms worsen.   Final Clinical Impressions(s) / UC Diagnoses   Final diagnoses:  Tendinitis of right wrist  Right wrist pain     Discharge Instructions      WRIST PAIN: You most likely  have tendinitis. Stressed avoiding painful activities . Reviewed RICE guidelines. Use medications as directed, including NSAIDs. If no NSAIDs have been prescribed for you today, you may  take Aleve or Motrin over the counter. May use Tylenol in between doses of NSAIDs.  If no improvement in the next 1 follow up with orthopedics.   You have a condition requiring you to follow up with Orthopedics so please call one of the following office for appointment:   [facility] [address] [place], [place] [address] Phone: [phone]  [facility] [address] [place], [place] [address] Phone: [phone]         ED Prescriptions     Medication Sig Dispense Auth. Provider   predniSONE (DELTASONE) 10 MG tablet Take 6 tabs po on day 1 and decrease by 1 tab daily until complete 21 tablet Shirlee Latch, PA-C      I have reviewed the PDMP during this encounter.   Shirlee Latch, PA-C 07/22/21 [phone]

## 2021-07-22 NOTE — Discharge Instructions (Signed)
WRIST PAIN: You most likely have tendinitis. Stressed avoiding painful activities . Reviewed RICE guidelines. Use medications as directed, including NSAIDs. If no NSAIDs have been prescribed for you today, you may take Aleve or Motrin over the counter. May use Tylenol in between doses of NSAIDs.  If no improvement in the next 1 follow up with orthopedics.   You have a condition requiring you to follow up with Orthopedics so please call one of the following office for appointment:   [facility] [address] [place], [place] [address] Phone: [phone]  [facility] [address] [place], [place] [address] Phone: [phone]

## 2021-07-22 NOTE — ED Triage Notes (Signed)
Pt here with C/O right wrist pain for 3 weeks, was at work felt something pop, has been in pain since, more when moving thumb, no swelling. Has had tendon repair on unlar side of hand.

## 2022-05-03 ENCOUNTER — Ambulatory Visit (INDEPENDENT_AMBULATORY_CARE_PROVIDER_SITE_OTHER): Payer: Managed Care, Other (non HMO) | Admitting: Family

## 2022-05-03 ENCOUNTER — Encounter: Payer: Self-pay | Admitting: Family

## 2022-05-03 VITALS — BP 128/80 | HR 65 | Ht 70.0 in | Wt 200.0 lb

## 2022-05-03 DIAGNOSIS — E538 Deficiency of other specified B group vitamins: Secondary | ICD-10-CM | POA: Diagnosis not present

## 2022-05-03 DIAGNOSIS — R7303 Prediabetes: Secondary | ICD-10-CM | POA: Diagnosis not present

## 2022-05-03 DIAGNOSIS — E559 Vitamin D deficiency, unspecified: Secondary | ICD-10-CM | POA: Diagnosis not present

## 2022-05-03 DIAGNOSIS — R5383 Other fatigue: Secondary | ICD-10-CM

## 2022-05-03 DIAGNOSIS — Z125 Encounter for screening for malignant neoplasm of prostate: Secondary | ICD-10-CM

## 2022-05-03 DIAGNOSIS — E782 Mixed hyperlipidemia: Secondary | ICD-10-CM | POA: Diagnosis not present

## 2022-05-03 DIAGNOSIS — G4733 Obstructive sleep apnea (adult) (pediatric): Secondary | ICD-10-CM

## 2022-05-03 DIAGNOSIS — Z1211 Encounter for screening for malignant neoplasm of colon: Secondary | ICD-10-CM

## 2022-05-03 MED ORDER — AZITHROMYCIN 250 MG PO TABS: ORAL_TABLET | ORAL | 0 refills | Status: AC

## 2022-05-04 ENCOUNTER — Encounter: Payer: Self-pay | Admitting: Family

## 2022-05-04 DIAGNOSIS — R5383 Other fatigue: Secondary | ICD-10-CM | POA: Insufficient documentation

## 2022-05-04 DIAGNOSIS — G4733 Obstructive sleep apnea (adult) (pediatric): Secondary | ICD-10-CM | POA: Insufficient documentation

## 2022-05-04 LAB — CMP14+EGFR
ALT: 65 IU/L — ABNORMAL HIGH (ref 0–44)
AST: 26 IU/L (ref 0–40)
Albumin/Globulin Ratio: 2.3 — ABNORMAL HIGH (ref 1.2–2.2)
Albumin: 4.8 g/dL (ref 4.1–5.1)
Alkaline Phosphatase: 82 IU/L (ref 44–121)
BUN/Creatinine Ratio: 9 (ref 9–20)
BUN: 8 mg/dL (ref 6–24)
Bilirubin Total: 0.2 mg/dL (ref 0.0–1.2)
CO2: 26 mmol/L (ref 20–29)
Calcium: 9.6 mg/dL (ref 8.7–10.2)
Chloride: 103 mmol/L (ref 96–106)
Creatinine, Ser: 0.87 mg/dL (ref 0.76–1.27)
Globulin, Total: 2.1 g/dL (ref 1.5–4.5)
Glucose: 98 mg/dL (ref 70–99)
Potassium: 4.9 mmol/L (ref 3.5–5.2)
Sodium: 144 mmol/L (ref 134–144)
Total Protein: 6.9 g/dL (ref 6.0–8.5)
eGFR: 107 mL/min/{1.73_m2} (ref >59)

## 2022-05-04 LAB — LIPID PANEL
Chol/HDL Ratio: 4.2 ratio (ref 0.0–5.0)
Cholesterol, Total: 187 mg/dL (ref 100–199)
HDL: 45 mg/dL (ref >39)
LDL Chol Calc (NIH): 129 mg/dL — ABNORMAL HIGH (ref 0–99)
Triglycerides: 68 mg/dL (ref 0–149)
VLDL Cholesterol Cal: 13 mg/dL (ref 5–40)

## 2022-05-04 LAB — CBC WITH DIFFERENTIAL
Basophils Absolute: 0 10*3/uL (ref 0.0–0.2)
Basos: 0 %
EOS (ABSOLUTE): 0.1 10*3/uL (ref 0.0–0.4)
Eos: 1 %
Hematocrit: 42.2 % (ref 37.5–51.0)
Hemoglobin: 14.3 g/dL (ref 13.0–17.7)
Immature Grans (Abs): 0 10*3/uL (ref 0.0–0.1)
Immature Granulocytes: 0 %
Lymphocytes Absolute: 1.4 10*3/uL (ref 0.7–3.1)
Lymphs: 20 %
MCH: 32.4 pg (ref 26.6–33.0)
MCHC: 33.9 g/dL (ref 31.5–35.7)
MCV: 96 fL (ref 79–97)
Monocytes Absolute: 0.6 10*3/uL (ref 0.1–0.9)
Monocytes: 8 %
Neutrophils Absolute: 4.8 10*3/uL (ref 1.4–7.0)
Neutrophils: 71 %
RBC: 4.42 x10E6/uL (ref 4.14–5.80)
RDW: 12.5 % (ref 11.6–15.4)
WBC: 6.9 10*3/uL (ref 3.4–10.8)

## 2022-05-04 LAB — PSA: Prostate Specific Ag, Serum: 0.5 ng/mL (ref 0.0–4.0)

## 2022-05-04 LAB — HEMOGLOBIN A1C
Est. average glucose Bld gHb Est-mCnc: 126 mg/dL
Hgb A1c MFr Bld: 6 % — ABNORMAL HIGH (ref 4.8–5.6)

## 2022-05-04 LAB — VITAMIN D 25 HYDROXY (VIT D DEFICIENCY, FRACTURES): Vit D, 25-Hydroxy: 35.3 ng/mL (ref 30.0–100.0)

## 2022-05-04 LAB — TSH: TSH: 1.07 u[IU]/mL (ref 0.450–4.500)

## 2022-05-04 LAB — VITAMIN B12: Vitamin B-12: 597 pg/mL (ref 232–1245)

## 2022-05-04 NOTE — Progress Notes (Deleted)
Sleep Questionnaire: 5/10

## 2022-05-04 NOTE — Progress Notes (Signed)
New Patient Office Visit  Subjective    Patient ID: Tony Galvan, male    DOB: 28-May-1975  Age: 47 y.o. MRN: [id number]  CC:  Chief Complaint  Patient presents with   Establish Care    New Patient    HPI Tony Galvan presents to establish care  He has not had a PCP in several years, but now has insurance and would like to establish.   He feels well overall, but needs labs and says that he has also got a strong family history of colon cancer and polyps, so he would like to start working on colon cancer screening as well.   He and his girlfriend both say he snores loudly and stops breathing while sleeping.  They live in a trailer, and she can hear him snoring all the way at the other end of the trailer if she gets up while he is sleeping.  They are inquiring about a sleep study.   No other concerns today.     Outpatient Encounter Medications as of 05/03/2022  Medication Sig   azithromycin (ZITHROMAX) 250 MG tablet Take 2 tablets (500 mg total) by mouth daily for 1 day, THEN 1 tablet (250 mg total) daily for 4 days.   predniSONE (DELTASONE) 10 MG tablet Take 6 tabs po on day 1 and decrease by 1 tab daily until complete   No facility-administered encounter medications on file as of 05/03/2022.    Past Medical History:  Diagnosis Date   Meningitis     Past Surgical History:  Procedure Laterality Date   IRRIGATION AND DEBRIDEMENT KNEE  01/02/2011   Procedure: IRRIGATION AND DEBRIDEMENT KNEE;  Surgeon: Meredith Pel;  Location: [facility];  Service: Orthopedics;  Laterality: Left;   TENDON REPAIR      Family History  Problem Relation Age of Onset   Hypertension Father     Social History   Socioeconomic History   Marital status: Single    Spouse name: Not on file   Number of children: Not on file   Years of education: Not on file   Highest education level: Not on file  Occupational History   Not on file  Tobacco Use   Smoking status: Former     Packs/day: 0.50    Types: Cigarettes    Quit date: 10/2020    Years since quitting: 1.5   Smokeless tobacco: Never  Vaping Use   Vaping Use: Every day  Substance and Sexual Activity   Alcohol use: Not Currently    Comment: "sober for 7 years"   Drug use: No   Sexual activity: Yes  Other Topics Concern   Not on file  Social History Narrative   Not on file   Social Determinants of Health   Financial Resource Strain: Not on file  Food Insecurity: Not on file  Transportation Needs: Not on file  Physical Activity: Not on file  Stress: Not on file  Social Connections: Not on file  Intimate Partner Violence: Not on file    Review of Systems  HENT:         Snoring.  All other systems reviewed and are negative.       Objective    BP 128/80   Pulse 65   Ht '5\' 10"'$  (1.778 m)   Wt 200 lb (90.7 kg)   SpO2 96%   BMI 28.70 kg/m   Physical Exam Vitals and nursing note reviewed.  Constitutional:  Appearance: Normal appearance. He is normal weight.  Eyes:     Pupils: Pupils are equal, round, and reactive to light.  Cardiovascular:     Rate and Rhythm: Normal rate and regular rhythm.     Pulses: Normal pulses.     Heart sounds: Normal heart sounds.  Pulmonary:     Effort: Pulmonary effort is normal.     Breath sounds: Normal breath sounds.  Neurological:     Mental Status: He is alert.  Psychiatric:        Mood and Affect: Mood normal.        Behavior: Behavior normal.        Assessment & Plan:   Problem List Items Addressed This Visit   None Visit Diagnoses     B12 deficiency due to diet    -  Primary   Relevant Orders   Vitamin B12 (Completed)   Mixed hyperlipidemia       Relevant Orders   Lipid panel (Completed)   Vitamin D deficiency, unspecified       Relevant Orders   VITAMIN D 25 Hydroxy (Vit-D Deficiency, Fractures) (Completed)   Prediabetes       Relevant Orders   Hemoglobin A1c (Completed)   Encounter for screening for malignant  neoplasm of prostate       Relevant Orders   PSA (Completed)   Obstructive sleep apnea syndrome       Relevant Orders   Home sleep test   Other fatigue       Relevant Orders   CBC With Differential (Completed)   CMP14+EGFR (Completed)   TSH (Completed)   Screening for malignant neoplasm of colon       Relevant Orders   Ambulatory referral to Gastroenterology       Return in about 2 weeks (around 05/17/2022) for NP F/U.   Total time spent: 30 minutes  Mechele Claude, FNP  05/03/2022

## 2022-05-19 ENCOUNTER — Encounter: Payer: Self-pay | Admitting: Family

## 2022-05-19 ENCOUNTER — Ambulatory Visit: Payer: Managed Care, Other (non HMO) | Admitting: Family

## 2022-05-19 VITALS — BP 144/72 | HR 83 | Ht 70.0 in | Wt 211.6 lb

## 2022-05-19 DIAGNOSIS — E7849 Other hyperlipidemia: Secondary | ICD-10-CM

## 2022-05-19 DIAGNOSIS — D485 Neoplasm of uncertain behavior of skin: Secondary | ICD-10-CM

## 2022-05-19 DIAGNOSIS — R7303 Prediabetes: Secondary | ICD-10-CM

## 2022-05-19 DIAGNOSIS — E785 Hyperlipidemia, unspecified: Secondary | ICD-10-CM | POA: Insufficient documentation

## 2022-05-19 NOTE — Progress Notes (Signed)
Established Patient Office Visit  Subjective:  Patient ID: Tony Galvan, male    DOB: 06-Dec-1975  Age: 47 y.o. MRN: [id number]  Chief Complaint  Patient presents with   Follow-up    2 week follow up    Pt. Here today for 2 week n/p follow up.   Had labs done at that time, so we will review in detail today.  Labs: LDL is elevated, A1C is in prediabetic ranges.   Has not heard from the sleep study yet.  Also needs referral to dermatology, he has several moles he is concerned about.   No other concerns at this time.   Past Medical History:  Diagnosis Date   Meningitis     Past Surgical History:  Procedure Laterality Date   IRRIGATION AND DEBRIDEMENT KNEE  01/02/2011   Procedure: IRRIGATION AND DEBRIDEMENT KNEE;  Surgeon: Meredith Pel;  Location: [facility];  Service: Orthopedics;  Laterality: Left;   TENDON REPAIR      Social History   Socioeconomic History   Marital status: Single    Spouse name: Not on file   Number of children: Not on file   Years of education: Not on file   Highest education level: Not on file  Occupational History   Not on file  Tobacco Use   Smoking status: Former    Packs/day: .5    Types: Cigarettes    Quit date: 10/2020    Years since quitting: 1.5   Smokeless tobacco: Never  Vaping Use   Vaping Use: Every day  Substance and Sexual Activity   Alcohol use: Not Currently    Comment: "sober for 7 years"   Drug use: No   Sexual activity: Yes  Other Topics Concern   Not on file  Social History Narrative   Not on file   Social Determinants of Health   Financial Resource Strain: Not on file  Food Insecurity: Not on file  Transportation Needs: Not on file  Physical Activity: Not on file  Stress: Not on file  Social Connections: Not on file  Intimate Partner Violence: Not on file    Family History  Problem Relation Age of Onset   Hypertension Father    Colon polyps Father    Cancer Paternal Grandfather        colon     Allergies  Allergen Reactions   Penicillins Anaphylaxis and Rash   Vicodin [Hydrocodone-Acetaminophen] Itching    Review of Systems  Constitutional:  Positive for malaise/fatigue (fatigue, despite getting a full night of sleep.).  Skin:        Multiple moles he is concerned about.   All other systems reviewed and are negative.      Objective:   BP (!) 144/72   Pulse 83   Ht 5\' 10"  (1.778 m)   Wt 211 lb 9.6 oz (96 kg)   SpO2 97%   BMI 30.36 kg/m   Vitals:   05/19/22 1450  BP: (!) 144/72  Pulse: 83  Height: 5\' 10"  (1.778 m)  Weight: 211 lb 9.6 oz (96 kg)  SpO2: 97%  BMI (Calculated): 30.36    Physical Exam Vitals and nursing note reviewed.  Constitutional:      Appearance: Normal appearance. He is normal weight.  Eyes:     Pupils: Pupils are equal, round, and reactive to light.  Cardiovascular:     Rate and Rhythm: Normal rate and regular rhythm.     Pulses: Normal pulses.  Heart sounds: Normal heart sounds.  Pulmonary:     Effort: Pulmonary effort is normal.     Breath sounds: Normal breath sounds.  Neurological:     General: No focal deficit present.     Mental Status: He is alert and oriented to person, place, and time.  Psychiatric:        Mood and Affect: Mood normal.        Behavior: Behavior normal.        Thought Content: Thought content normal.        Judgment: Judgment normal.      No results found for any visits on 05/19/22.  Recent Results (from the past 2160 hour(s))  Lipid panel     Status: Abnormal   Collection Time: 05/03/22  2:51 PM  Result Value Ref Range   Cholesterol, Total 187 100 - 199 mg/dL   Triglycerides 68 0 - 149 mg/dL   HDL 45 >39 mg/dL   VLDL Cholesterol Cal 13 5 - 40 mg/dL   LDL Chol Calc (NIH) 129 (H) 0 - 99 mg/dL   Chol/HDL Ratio 4.2 0.0 - 5.0 ratio    Comment:                                   T. Chol/HDL Ratio                                             Men  Women                               1/2  Avg.Risk  3.4    3.3                                   Avg.Risk  5.0    4.4                                2X Avg.Risk  9.6    7.1                                3X Avg.Risk 23.4   11.0   VITAMIN D 25 Hydroxy (Vit-D Deficiency, Fractures)     Status: None   Collection Time: 05/03/22  2:51 PM  Result Value Ref Range   Vit D, 25-Hydroxy 35.3 30.0 - 100.0 ng/mL    Comment: Vitamin D deficiency has been defined by the [place] practice guideline as a level of serum 25-OH vitamin D less than 20 ng/mL (1,2). The Endocrine Society went on to further define vitamin D insufficiency as a level between 21 and 29 ng/mL (2). 1. IOM (Institute of Medicine). 2010. Dietary reference    intakes for calcium and D. [place]: The    [employer]. 2. Holick MF, Binkley [place], Bischoff-Ferrari HA, et al.    Evaluation, treatment, and prevention of vitamin D    deficiency: an Endocrine Society clinical practice    guideline. JCEM. 2011 Jul; 96(7):1911-30.   CBC With Differential  Status: None   Collection Time: 05/03/22  2:51 PM  Result Value Ref Range   WBC 6.9 3.4 - 10.8 x10E3/uL   RBC 4.42 4.14 - 5.80 x10E6/uL   Hemoglobin 14.3 13.0 - 17.7 g/dL   Hematocrit 42.2 37.5 - 51.0 %   MCV 96 79 - 97 fL   MCH 32.4 26.6 - 33.0 pg   MCHC 33.9 31.5 - 35.7 g/dL   RDW 12.5 11.6 - 15.4 %   Neutrophils 71 Not Estab. %   Lymphs 20 Not Estab. %   Monocytes 8 Not Estab. %   Eos 1 Not Estab. %   Basos 0 Not Estab. %   Neutrophils Absolute 4.8 1.4 - 7.0 x10E3/uL   Lymphocytes Absolute 1.4 0.7 - 3.1 x10E3/uL   Monocytes Absolute 0.6 0.1 - 0.9 x10E3/uL   EOS (ABSOLUTE) 0.1 0.0 - 0.4 x10E3/uL   Basophils Absolute 0.0 0.0 - 0.2 x10E3/uL   Immature Granulocytes 0 Not Estab. %   Immature Grans (Abs) 0.0 0.0 - 0.1 x10E3/uL  CMP14+EGFR     Status: Abnormal   Collection Time: 05/03/22  2:51 PM  Result Value Ref Range   Glucose 98 70 - 99 mg/dL   BUN 8 6 - 24  mg/dL   Creatinine, Ser 0.87 0.76 - 1.27 mg/dL   eGFR 107 >59 mL/min/1.73   BUN/Creatinine Ratio 9 9 - 20   Sodium 144 134 - 144 mmol/L   Potassium 4.9 3.5 - 5.2 mmol/L   Chloride 103 96 - 106 mmol/L   CO2 26 20 - 29 mmol/L   Calcium 9.6 8.7 - 10.2 mg/dL   Total Protein 6.9 6.0 - 8.5 g/dL   Albumin 4.8 4.1 - 5.1 g/dL   Globulin, Total 2.1 1.5 - 4.5 g/dL   Albumin/Globulin Ratio 2.3 (H) 1.2 - 2.2   Bilirubin Total 0.2 0.0 - 1.2 mg/dL   Alkaline Phosphatase 82 44 - 121 IU/L   AST 26 0 - 40 IU/L   ALT 65 (H) 0 - 44 IU/L  TSH     Status: None   Collection Time: 05/03/22  2:51 PM  Result Value Ref Range   TSH 1.070 0.450 - 4.500 uIU/mL  Hemoglobin A1c     Status: Abnormal   Collection Time: 05/03/22  2:51 PM  Result Value Ref Range   Hgb A1c MFr Bld 6.0 (H) 4.8 - 5.6 %    Comment:          Prediabetes: 5.7 - 6.4          Diabetes: >6.4          Glycemic control for adults with diabetes: <7.0    Est. average glucose Bld gHb Est-mCnc 126 mg/dL  Vitamin B12     Status: None   Collection Time: 05/03/22  2:51 PM  Result Value Ref Range   Vitamin B-12 597 232 - 1,245 pg/mL  PSA     Status: None   Collection Time: 05/03/22  2:51 PM  Result Value Ref Range   Prostate Specific Ag, Serum 0.5 0.0 - 4.0 ng/mL    Comment: Roche ECLIA methodology. According to the American Urological Association, Serum PSA should decrease and remain at undetectable levels after radical prostatectomy. The AUA defines biochemical recurrence as an initial PSA value 0.2 ng/mL or greater followed by a subsequent confirmatory PSA value 0.2 ng/mL or greater. Values obtained with different assay methods or kits cannot be used interchangeably. Results cannot be interpreted as absolute evidence of the  presence or absence of malignant disease.       Assessment & Plan:   Problem List Items Addressed This Visit     Prediabetes - Primary   Hyperlipidemia   Other Visit Diagnoses     Neoplasm of uncertain  behavior of skin       sending referral to dermatology for skin check   Relevant Orders   Ambulatory referral to Dermatology       Return in about 3 months (around 08/19/2022) for F/U.   Total time spent: 30 minutes  Mechele Claude, FNP  05/19/2022

## 2022-05-31 ENCOUNTER — Encounter: Payer: Self-pay | Admitting: Family

## 2022-06-22 ENCOUNTER — Ambulatory Visit: Payer: Managed Care, Other (non HMO)

## 2022-06-22 DIAGNOSIS — D125 Benign neoplasm of sigmoid colon: Secondary | ICD-10-CM

## 2022-06-22 DIAGNOSIS — D122 Benign neoplasm of ascending colon: Secondary | ICD-10-CM

## 2022-06-22 DIAGNOSIS — Z1211 Encounter for screening for malignant neoplasm of colon: Secondary | ICD-10-CM

## 2022-06-22 DIAGNOSIS — Z83719 Family history of colon polyps, unspecified: Secondary | ICD-10-CM

## 2022-06-22 DIAGNOSIS — K640 First degree hemorrhoids: Secondary | ICD-10-CM

## 2022-06-22 DIAGNOSIS — D128 Benign neoplasm of rectum: Secondary | ICD-10-CM

## 2022-06-22 DIAGNOSIS — D124 Benign neoplasm of descending colon: Secondary | ICD-10-CM

## 2022-06-22 DIAGNOSIS — D123 Benign neoplasm of transverse colon: Secondary | ICD-10-CM

## 2022-07-11 ENCOUNTER — Encounter: Payer: Self-pay | Admitting: Family

## 2022-07-11 DIAGNOSIS — G4733 Obstructive sleep apnea (adult) (pediatric): Secondary | ICD-10-CM

## 2022-07-12 MED ORDER — DOXYCYCLINE HYCLATE 100 MG PO CAPS: 100.0000 mg | ORAL_CAPSULE | Freq: Two times a day (BID) | ORAL | 0 refills | Status: DC

## 2022-07-12 MED ORDER — LIDOCAINE VISCOUS HCL 2 % MT SOLN: 15.0000 mL | Freq: Four times a day (QID) | OROMUCOSAL | 0 refills | Status: DC | PRN

## 2022-07-12 NOTE — Addendum Note (Signed)
Addended by: Grayling Congress on: 07/12/2022 03:20 PM   Modules accepted: Orders

## 2022-08-19 ENCOUNTER — Ambulatory Visit: Payer: Managed Care, Other (non HMO) | Admitting: Family

## 2023-01-17 ENCOUNTER — Encounter: Payer: Self-pay | Admitting: Family

## 2023-01-27 ENCOUNTER — Telehealth: Payer: Self-pay

## 2023-01-27 NOTE — Telephone Encounter (Signed)
Patient wants to know if you have his sleep study results. Please advise.

## 2023-03-06 ENCOUNTER — Encounter: Payer: Self-pay | Admitting: Family

## 2023-03-06 ENCOUNTER — Ambulatory Visit (INDEPENDENT_AMBULATORY_CARE_PROVIDER_SITE_OTHER): Payer: Managed Care, Other (non HMO) | Admitting: Family

## 2023-03-06 VITALS — BP 130/64 | HR 84 | Ht 70.0 in | Wt 217.4 lb

## 2023-03-06 DIAGNOSIS — Z0130 Encounter for examination of blood pressure without abnormal findings: Secondary | ICD-10-CM

## 2023-03-06 DIAGNOSIS — E559 Vitamin D deficiency, unspecified: Secondary | ICD-10-CM

## 2023-03-06 DIAGNOSIS — R5383 Other fatigue: Secondary | ICD-10-CM | POA: Diagnosis not present

## 2023-03-06 DIAGNOSIS — R7303 Prediabetes: Secondary | ICD-10-CM

## 2023-03-06 DIAGNOSIS — E538 Deficiency of other specified B group vitamins: Secondary | ICD-10-CM

## 2023-03-06 DIAGNOSIS — G4733 Obstructive sleep apnea (adult) (pediatric): Secondary | ICD-10-CM

## 2023-03-06 DIAGNOSIS — E7849 Other hyperlipidemia: Secondary | ICD-10-CM

## 2023-03-06 MED ORDER — ALPRAZOLAM 0.25 MG PO TABS: 0.2500 mg | ORAL_TABLET | Freq: Every evening | ORAL | 0 refills | Status: DC | PRN

## 2023-03-06 NOTE — Progress Notes (Signed)
 Established Patient Office Visit  Subjective:  Patient ID: Tony Galvan, male    DOB: 03-Sep-1975  Age: 48 y.o. MRN: [id number]  Chief Complaint  Patient presents with   Follow-up    Sleep study follow up    Patient is here for follow up appointment today.  He has a few concerns:  1) CPAP - [facility] has order, they have not yet delivered machine as they are waiting for this note.   2) Sleep: can fall asleep in the recliner or on the couch no problem, but can't seem to shut his mind off when he gets up to go to bed.   3) Low back pain.  He has been having severe low back pain especially after disc golf. Was wondering if he might be feeling the kidney stone he knows he has moving or if it is muscular.     No other concerns at this time.   Past Medical History:  Diagnosis Date   Meningitis     Past Surgical History:  Procedure Laterality Date   IRRIGATION AND DEBRIDEMENT KNEE  01/02/2011   Procedure: IRRIGATION AND DEBRIDEMENT KNEE;  Surgeon: Cordella Glendia Hutchinson;  Location: [facility];  Service: Orthopedics;  Laterality: Left;   TENDON REPAIR      Social History   Socioeconomic History   Marital status: Single    Spouse name: Not on file   Number of children: Not on file   Years of education: Not on file   Highest education level: Not on file  Occupational History   Not on file  Tobacco Use   Smoking status: Former    Current packs/day: 0.00    Types: Cigarettes    Quit date: 10/2020    Years since quitting: 2.3   Smokeless tobacco: Never  Vaping Use   Vaping status: Every Day  Substance and Sexual Activity   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   Drug use: No   Sexual activity: Yes  Other Topics Concern   Not on file  Social History Narrative   Not on file   Social Drivers of Health   Financial Resource Strain: Not on file  Food Insecurity: Not on file  Transportation Needs: Not on file  Physical Activity: Not on file  Stress:  Not on file  Social Connections: Not on file  Intimate Partner Violence: Not on file    Family History  Problem Relation Age of Onset   Hypertension Father    Colon polyps Father    Cancer Paternal Grandfather        colon    Allergies  Allergen Reactions   Penicillins Anaphylaxis and Rash   Vicodin [Hydrocodone-Acetaminophen ] Itching    Review of Systems  Psychiatric/Behavioral:  The patient is nervous/anxious and has insomnia.   All other systems reviewed and are negative.      Objective:   BP 130/64   Pulse 84   Ht 5' 10 (1.778 m)   Wt 217 lb 6.4 oz (98.6 kg)   SpO2 97%   BMI 31.19 kg/m   Vitals:   03/06/23 1346  BP: 130/64  Pulse: 84  Height: 5' 10 (1.778 m)  Weight: 217 lb 6.4 oz (98.6 kg)  SpO2: 97%  BMI (Calculated): 31.19    Physical Exam Vitals and nursing note reviewed.  Constitutional:      General: He is awake.     Appearance: Normal appearance. He is obese.  Eyes:  Pupils: Pupils are equal, round, and reactive to light.  Cardiovascular:     Rate and Rhythm: Normal rate and regular rhythm.     Pulses: Normal pulses.     Heart sounds: Normal heart sounds.  Pulmonary:     Effort: Pulmonary effort is normal.     Breath sounds: Normal breath sounds.  Neurological:     Mental Status: He is alert.  Psychiatric:        Attention and Perception: Attention and perception normal.        Mood and Affect: Mood and affect normal.        Speech: Speech normal.        Behavior: Behavior normal. Behavior is cooperative.        Thought Content: Thought content normal.        Cognition and Memory: Cognition and memory normal.        Judgment: Judgment normal.      Results for orders placed or performed in visit on 03/06/23  Lipid panel  Result Value Ref Range   Cholesterol, Total 203 (H) 100 - 199 mg/dL   Triglycerides 828 (H) 0 - 149 mg/dL   HDL 35 (L) >60 mg/dL   VLDL Cholesterol Cal 31 5 - 40 mg/dL   LDL Chol Calc (NIH) 862 (H) 0 - 99  mg/dL   Chol/HDL Ratio 5.8 (H) 0.0 - 5.0 ratio  VITAMIN D  25 Hydroxy (Vit-D Deficiency, Fractures)  Result Value Ref Range   Vit D, 25-Hydroxy 34.3 30.0 - 100.0 ng/mL  CMP14+EGFR  Result Value Ref Range   Glucose 124 (H) 70 - 99 mg/dL   BUN 12 6 - 24 mg/dL   Creatinine, Ser 9.06 0.76 - 1.27 mg/dL   eGFR 898 >40 fO/fpw/8.26   BUN/Creatinine Ratio 13 9 - 20   Sodium 143 134 - 144 mmol/L   Potassium 4.2 3.5 - 5.2 mmol/L   Chloride 105 96 - 106 mmol/L   CO2 20 20 - 29 mmol/L   Calcium 9.3 8.7 - 10.2 mg/dL   Total Protein 6.8 6.0 - 8.5 g/dL   Albumin 4.8 4.1 - 5.1 g/dL   Globulin, Total 2.0 1.5 - 4.5 g/dL   Bilirubin Total <9.7 0.0 - 1.2 mg/dL   Alkaline Phosphatase 84 44 - 121 IU/L   AST 24 0 - 40 IU/L   ALT 61 (H) 0 - 44 IU/L  TSH  Result Value Ref Range   TSH 0.822 0.450 - 4.500 uIU/mL  Hemoglobin A1c  Result Value Ref Range   Hgb A1c MFr Bld 6.1 (H) 4.8 - 5.6 %   Est. average glucose Bld gHb Est-mCnc 128 mg/dL  Vitamin A87  Result Value Ref Range   Vitamin B-12 399 232 - 1,245 pg/mL  CBC with Diff  Result Value Ref Range   WBC 5.6 3.4 - 10.8 x10E3/uL   RBC 4.45 4.14 - 5.80 x10E6/uL   Hemoglobin 14.4 13.0 - 17.7 g/dL   Hematocrit 57.4 62.4 - 51.0 %   MCV 96 79 - 97 fL   MCH 32.4 26.6 - 33.0 pg   MCHC 33.9 31.5 - 35.7 g/dL   RDW 87.2 88.3 - 84.5 %   Platelets 220 150 - 450 x10E3/uL   Neutrophils 65 Not Estab. %   Lymphs 25 Not Estab. %   Monocytes 8 Not Estab. %   Eos 1 Not Estab. %   Basos 1 Not Estab. %   Neutrophils Absolute 3.6 1.4 - 7.0 x10E3/uL  Lymphocytes Absolute 1.4 0.7 - 3.1 x10E3/uL   Monocytes Absolute 0.5 0.1 - 0.9 x10E3/uL   EOS (ABSOLUTE) 0.1 0.0 - 0.4 x10E3/uL   Basophils Absolute 0.0 0.0 - 0.2 x10E3/uL   Immature Granulocytes 0 Not Estab. %   Immature Grans (Abs) 0.0 0.0 - 0.1 x10E3/uL    Recent Results (from the past 2160 hours)  Lipid panel     Status: Abnormal   Collection Time: 03/06/23  2:29 PM  Result Value Ref Range   Cholesterol,  Total 203 (H) 100 - 199 mg/dL   Triglycerides 828 (H) 0 - 149 mg/dL   HDL 35 (L) >60 mg/dL   VLDL Cholesterol Cal 31 5 - 40 mg/dL   LDL Chol Calc (NIH) 862 (H) 0 - 99 mg/dL   Chol/HDL Ratio 5.8 (H) 0.0 - 5.0 ratio    Comment:                                   T. Chol/HDL Ratio                                             Men  Women                               1/2 Avg.Risk  3.4    3.3                                   Avg.Risk  5.0    4.4                                2X Avg.Risk  9.6    7.1                                3X Avg.Risk 23.4   11.0   VITAMIN D  25 Hydroxy (Vit-D Deficiency, Fractures)     Status: None   Collection Time: 03/06/23  2:29 PM  Result Value Ref Range   Vit D, 25-Hydroxy 34.3 30.0 - 100.0 ng/mL    Comment: Vitamin D  deficiency has been defined by the Institute of Medicine and an Endocrine Society practice guideline as a level of serum 25-OH vitamin D  less than 20 ng/mL (1,2). The Endocrine Society went on to further define vitamin D  insufficiency as a level between 21 and 29 ng/mL (2). 1. IOM (Institute of Medicine). 2010. Dietary reference    intakes for calcium and D. [place]  [place]: The    [employer]. 2. Holick MF, Binkley , Bischoff-Ferrari HA, et al.    Evaluation, treatment, and prevention of vitamin D     deficiency: an Endocrine Society clinical practice    guideline. JCEM. 2011 Jul; 96(7):1911-30.   CMP14+EGFR     Status: Abnormal   Collection Time: 03/06/23  2:29 PM  Result Value Ref Range   Glucose 124 (H) 70 - 99 mg/dL   BUN 12 6 - 24 mg/dL   Creatinine, Ser 9.06 0.76 - 1.27 mg/dL   eGFR 898 >40 fO/fpw/8.26   BUN/Creatinine Ratio 13 9 -  20   Sodium 143 134 - 144 mmol/L   Potassium 4.2 3.5 - 5.2 mmol/L   Chloride 105 96 - 106 mmol/L   CO2 20 20 - 29 mmol/L   Calcium 9.3 8.7 - 10.2 mg/dL   Total Protein 6.8 6.0 - 8.5 g/dL   Albumin 4.8 4.1 - 5.1 g/dL   Globulin, Total 2.0 1.5 - 4.5 g/dL   Bilirubin Total <9.7 0.0 - 1.2  mg/dL   Alkaline Phosphatase 84 44 - 121 IU/L   AST 24 0 - 40 IU/L   ALT 61 (H) 0 - 44 IU/L  TSH     Status: None   Collection Time: 03/06/23  2:29 PM  Result Value Ref Range   TSH 0.822 0.450 - 4.500 uIU/mL  Hemoglobin A1c     Status: Abnormal   Collection Time: 03/06/23  2:29 PM  Result Value Ref Range   Hgb A1c MFr Bld 6.1 (H) 4.8 - 5.6 %    Comment:          Prediabetes: 5.7 - 6.4          Diabetes: >6.4          Glycemic control for adults with diabetes: <7.0    Est. average glucose Bld gHb Est-mCnc 128 mg/dL  Vitamin A87     Status: None   Collection Time: 03/06/23  2:29 PM  Result Value Ref Range   Vitamin B-12 399 232 - 1,245 pg/mL  CBC with Diff     Status: None   Collection Time: 03/06/23  2:29 PM  Result Value Ref Range   WBC 5.6 3.4 - 10.8 x10E3/uL   RBC 4.45 4.14 - 5.80 x10E6/uL   Hemoglobin 14.4 13.0 - 17.7 g/dL   Hematocrit 57.4 62.4 - 51.0 %   MCV 96 79 - 97 fL   MCH 32.4 26.6 - 33.0 pg   MCHC 33.9 31.5 - 35.7 g/dL   RDW 87.2 88.3 - 84.5 %   Platelets 220 150 - 450 x10E3/uL   Neutrophils 65 Not Estab. %   Lymphs 25 Not Estab. %   Monocytes 8 Not Estab. %   Eos 1 Not Estab. %   Basos 1 Not Estab. %   Neutrophils Absolute 3.6 1.4 - 7.0 x10E3/uL   Lymphocytes Absolute 1.4 0.7 - 3.1 x10E3/uL   Monocytes Absolute 0.5 0.1 - 0.9 x10E3/uL   EOS (ABSOLUTE) 0.1 0.0 - 0.4 x10E3/uL   Basophils Absolute 0.0 0.0 - 0.2 x10E3/uL   Immature Granulocytes 0 Not Estab. %   Immature Grans (Abs) 0.0 0.0 - 0.1 x10E3/uL       Assessment & Plan:   Problem List Items Addressed This Visit       Respiratory   Obstructive sleep apnea syndrome - Primary   Patient is awaiting CPAP.  Will get this note sent to [facility] so they can deliver his machine.         Relevant Orders   CMP14+EGFR (Completed)   CBC with Diff (Completed)     Other   Other fatigue   Relevant Orders   CMP14+EGFR (Completed)   TSH (Completed)   CBC with Diff (Completed)   Prediabetes   A1C  Continues to be in prediabetic ranges.  Will reassess at follow up after next lab check.  Patient counseled on dietary choices and verbalized understanding.  Patient educated on foods that contain carbohydrates and the need to decrease intake.  We discussed prediabetes, and what it means and the  need for strict dietary control to prevent progression to type 2 diabetes.  Advised to decrease intake of sugary drinks, including sodas, sweet tea, and some juices, and of starch and sugar heavy foods (ie., potatoes, rice, bread, pasta, desserts). He verbalizes understanding and agreement with the changes discussed today.        Relevant Orders   CMP14+EGFR (Completed)   Hemoglobin A1c (Completed)   CBC with Diff (Completed)   Hyperlipidemia   Checking labs today.  Continue current therapy for lipid control. Will modify as needed based on labwork results.        Relevant Orders   Lipid panel (Completed)   CMP14+EGFR (Completed)   CBC with Diff (Completed)   Other Visit Diagnoses       Vitamin D  deficiency, unspecified       Checking labs today.  Will continue supplements as needed.   Relevant Orders   VITAMIN D  25 Hydroxy (Vit-D Deficiency, Fractures) (Completed)   CMP14+EGFR (Completed)   CBC with Diff (Completed)     B12 deficiency due to diet       Checking labs today.  Will continue supplements as needed.   Relevant Orders   CMP14+EGFR (Completed)   Vitamin B12 (Completed)   CBC with Diff (Completed)       Return in about 2 months (around 05/04/2023).   Total time spent: 20 minutes  ALAN CHRISTELLA ARRANT, FNP  03/06/2023   This document may have been prepared by [facility] Voice Recognition software and as such may include unintentional dictation errors.

## 2023-03-07 LAB — CBC WITH DIFFERENTIAL/PLATELET
Basophils Absolute: 0 10*3/uL (ref 0.0–0.2)
Basos: 1 %
EOS (ABSOLUTE): 0.1 10*3/uL (ref 0.0–0.4)
Eos: 1 %
Hematocrit: 42.5 % (ref 37.5–51.0)
Hemoglobin: 14.4 g/dL (ref 13.0–17.7)
Immature Grans (Abs): 0 10*3/uL (ref 0.0–0.1)
Immature Granulocytes: 0 %
Lymphocytes Absolute: 1.4 10*3/uL (ref 0.7–3.1)
Lymphs: 25 %
MCH: 32.4 pg (ref 26.6–33.0)
MCHC: 33.9 g/dL (ref 31.5–35.7)
MCV: 96 fL (ref 79–97)
Monocytes Absolute: 0.5 10*3/uL (ref 0.1–0.9)
Monocytes: 8 %
Neutrophils Absolute: 3.6 10*3/uL (ref 1.4–7.0)
Neutrophils: 65 %
Platelets: 220 10*3/uL (ref 150–450)
RBC: 4.45 x10E6/uL (ref 4.14–5.80)
RDW: 12.7 % (ref 11.6–15.4)
WBC: 5.6 10*3/uL (ref 3.4–10.8)

## 2023-03-07 LAB — CMP14+EGFR
ALT: 61 [IU]/L — ABNORMAL HIGH (ref 0–44)
AST: 24 [IU]/L (ref 0–40)
Albumin: 4.8 g/dL (ref 4.1–5.1)
Alkaline Phosphatase: 84 [IU]/L (ref 44–121)
BUN/Creatinine Ratio: 13 (ref 9–20)
BUN: 12 mg/dL (ref 6–24)
Bilirubin Total: <0.2 mg/dL (ref 0.0–1.2)
CO2: 20 mmol/L (ref 20–29)
Calcium: 9.3 mg/dL (ref 8.7–10.2)
Chloride: 105 mmol/L (ref 96–106)
Creatinine, Ser: 0.93 mg/dL (ref 0.76–1.27)
Globulin, Total: 2 g/dL (ref 1.5–4.5)
Glucose: 124 mg/dL — ABNORMAL HIGH (ref 70–99)
Potassium: 4.2 mmol/L (ref 3.5–5.2)
Sodium: 143 mmol/L (ref 134–144)
Total Protein: 6.8 g/dL (ref 6.0–8.5)
eGFR: 101 mL/min/{1.73_m2} (ref >59)

## 2023-03-07 LAB — TSH: TSH: 0.822 u[IU]/mL (ref 0.450–4.500)

## 2023-03-07 LAB — VITAMIN B12: Vitamin B-12: 399 pg/mL (ref 232–1245)

## 2023-03-07 LAB — LIPID PANEL
Chol/HDL Ratio: 5.8 {ratio} — ABNORMAL HIGH (ref 0.0–5.0)
Cholesterol, Total: 203 mg/dL — ABNORMAL HIGH (ref 100–199)
HDL: 35 mg/dL — ABNORMAL LOW (ref >39)
LDL Chol Calc (NIH): 137 mg/dL — ABNORMAL HIGH (ref 0–99)
Triglycerides: 171 mg/dL — ABNORMAL HIGH (ref 0–149)
VLDL Cholesterol Cal: 31 mg/dL (ref 5–40)

## 2023-03-07 LAB — HEMOGLOBIN A1C
Est. average glucose Bld gHb Est-mCnc: 128 mg/dL
Hgb A1c MFr Bld: 6.1 % — ABNORMAL HIGH (ref 4.8–5.6)

## 2023-03-07 LAB — VITAMIN D 25 HYDROXY (VIT D DEFICIENCY, FRACTURES): Vit D, 25-Hydroxy: 34.3 ng/mL (ref 30.0–100.0)

## 2023-03-07 NOTE — Assessment & Plan Note (Signed)
 A1C Continues to be in prediabetic ranges.  Will reassess at follow up after next lab check.  Patient counseled on dietary choices and verbalized understanding.  Patient educated on foods that contain carbohydrates and the need to decrease intake.  We discussed prediabetes, and what it means and the need for strict dietary control to prevent progression to type 2 diabetes.  Advised to decrease intake of sugary drinks, including sodas, sweet tea, and some juices, and of starch and sugar heavy foods (ie., potatoes, rice, bread, pasta, desserts). He verbalizes understanding and agreement with the changes discussed today.

## 2023-03-07 NOTE — Telephone Encounter (Signed)
 Informed Tony Galvan with [facility] that the office note was completed and also let them know on parachute

## 2023-03-07 NOTE — Assessment & Plan Note (Signed)
 Patient is awaiting CPAP.  Will get this note sent to [facility] so they can deliver his machine.

## 2023-03-07 NOTE — Assessment & Plan Note (Signed)
 Checking labs today.  Continue current therapy for lipid control. Will modify as needed based on labwork results.

## 2023-03-11 ENCOUNTER — Encounter: Payer: Self-pay | Admitting: Family

## 2023-03-29 ENCOUNTER — Telehealth: Payer: Managed Care, Other (non HMO) | Admitting: Physician Assistant

## 2023-03-29 DIAGNOSIS — B9789 Other viral agents as the cause of diseases classified elsewhere: Secondary | ICD-10-CM | POA: Diagnosis not present

## 2023-03-29 DIAGNOSIS — J019 Acute sinusitis, unspecified: Secondary | ICD-10-CM

## 2023-03-29 MED ORDER — IPRATROPIUM BROMIDE 0.03 % NA SOLN: 2.0000 | Freq: Two times a day (BID) | NASAL | 0 refills | Status: DC

## 2023-03-29 NOTE — Progress Notes (Signed)

## 2023-03-29 NOTE — Progress Notes (Signed)
 I have spent 5 minutes in review of e-visit questionnaire, review and updating patient chart, medical decision making and response to patient.   Piedad Climes, PA-C

## 2023-05-04 ENCOUNTER — Ambulatory Visit (INDEPENDENT_AMBULATORY_CARE_PROVIDER_SITE_OTHER): Payer: Managed Care, Other (non HMO) | Admitting: Family

## 2023-05-04 ENCOUNTER — Encounter: Payer: Self-pay | Admitting: Family

## 2023-05-04 VITALS — BP 127/72 | HR 78 | Ht 70.0 in | Wt 218.0 lb

## 2023-05-04 DIAGNOSIS — G4733 Obstructive sleep apnea (adult) (pediatric): Secondary | ICD-10-CM

## 2023-05-04 DIAGNOSIS — Z0130 Encounter for examination of blood pressure without abnormal findings: Secondary | ICD-10-CM

## 2023-05-04 DIAGNOSIS — F5101 Primary insomnia: Secondary | ICD-10-CM

## 2023-05-04 DIAGNOSIS — R7303 Prediabetes: Secondary | ICD-10-CM

## 2023-05-04 MED ORDER — ALPRAZOLAM 0.5 MG PO TABS: 0.5000 mg | ORAL_TABLET | Freq: Every evening | ORAL | 2 refills | Status: AC | PRN

## 2023-05-04 MED ORDER — TAMSULOSIN HCL 0.4 MG PO CAPS: 0.4000 mg | ORAL_CAPSULE | Freq: Every day | ORAL | 0 refills | Status: DC

## 2023-05-27 ENCOUNTER — Other Ambulatory Visit: Payer: Self-pay | Admitting: Family

## 2023-07-04 ENCOUNTER — Ambulatory Visit: Admitting: Family

## 2023-07-06 ENCOUNTER — Ambulatory Visit (INDEPENDENT_AMBULATORY_CARE_PROVIDER_SITE_OTHER): Admitting: Urology

## 2023-07-06 ENCOUNTER — Ambulatory Visit
Admission: RE | Admit: 2023-07-06 | Discharge: 2023-07-06 | Disposition: A | Discharge status: Home / Self Care | Source: Ambulatory Visit | Attending: Urology | Admitting: Urology

## 2023-07-06 VITALS — BP 130/72 | HR 96 | Ht 70.0 in | Wt 210.0 lb

## 2023-07-06 DIAGNOSIS — E7849 Other hyperlipidemia: Secondary | ICD-10-CM | POA: Diagnosis present

## 2023-07-06 DIAGNOSIS — N200 Calculus of kidney: Secondary | ICD-10-CM | POA: Diagnosis not present

## 2023-07-06 DIAGNOSIS — M545 Low back pain, unspecified: Secondary | ICD-10-CM | POA: Diagnosis not present

## 2023-07-06 NOTE — Progress Notes (Signed)
 I, Maysun Jamey Mccallum, acting as a Neurosurgeon for Tony Knapp, MD., have documented all relevant documentation on the behalf of Tony Knapp, MD, as directed by Tony Knapp, MD while in the presence of Tony Knapp, MD.  Discussed the use of AI scribe software for clinical note transcription with the patient, who gave verbal consent to proceed.   07/16/2023 11:42 PM   Tony Galvan September 18, 1975 [id number]  Referring provider: Yehuda Helms, MD [address] [facility] [place],  [place] [address]  Chief Complaint  Patient presents with   New Patient (Initial Visit)    HPI: Tony Galvan is a 48 y.o. male referred for nephrolithiasis.  Incidentally found to have a three-millimeter non-obstructing right renal calculus on a CT in November 2012.  He had a CT in November 2020 for a different problem and the stone had not changed in size or position. He has had no imaging since that timed. 2 year history of right back pain. He plays disc golf and the pain is exacerbated with the motion of throwing the frisbee,  Patient was told by Dr. Claudius Cumins that the pain might be due to the stone moving. No bothersome lower urinary tract symptoms. No gross hematuria.   PMH: Past Medical History:  Diagnosis Date   Meningitis     Surgical History: Past Surgical History:  Procedure Laterality Date   IRRIGATION AND DEBRIDEMENT KNEE  01/02/2011   Procedure: IRRIGATION AND DEBRIDEMENT KNEE;  Surgeon: Jasmine Mesi;  Location: [facility] OR;  Service: Orthopedics;  Laterality: Left;   TENDON REPAIR      Home Medications:  Allergies as of 07/06/2023       Reactions   Penicillins Anaphylaxis, Rash   Vicodin [hydrocodone-acetaminophen ] Itching        Medication List        Accurate as of Jul 06, 2023 11:42 PM. If you have any questions, ask your nurse or doctor.          STOP taking these medications    ipratropium 0.03 % nasal spray Commonly known as:  ATROVENT    tamsulosin  0.4 MG Caps capsule Commonly known as: FLOMAX        TAKE these medications    ALPRAZolam  0.5 MG tablet Commonly known as: XANAX  Take 1 tablet (0.5 mg total) by mouth at bedtime as needed for anxiety or sleep.        Allergies:  Allergies  Allergen Reactions   Penicillins Anaphylaxis and Rash   Vicodin [Hydrocodone-Acetaminophen ] Itching    Family History: Family History  Problem Relation Age of Onset   Hypertension Father    Colon polyps Father    Cancer Paternal Grandfather        colon    Social History:  reports that he quit smoking about 2 years ago. His smoking use included cigarettes. He has never used smokeless tobacco. He reports that he does not currently use alcohol. He reports that he does not use drugs.   Physical Exam: BP 130/72   Pulse 96   Ht 5\' 10"  (1.778 m)   Wt 210 lb (95.3 kg)   BMI 30.13 kg/m   Constitutional:  Alert and oriented, No acute distress. HEENT: [place] AT, moist mucus membranes.  Trachea midline, no masses. Cardiovascular: No clubbing, cyanosis, or edema. Respiratory: Normal respiratory effort, no increased work of breathing. Psychiatric: Normal mood and affect.  Pertinent imaging CT was personally reviewed and interpreted.   CT  EXAM: CT ABDOMEN AND PELVIS WITHOUT CONTRAST   TECHNIQUE: Multidetector CT imaging of the abdomen and pelvis was performed following the standard protocol without IV contrast.   COMPARISON:  01/02/2011   FINDINGS: Lower chest: No basilar lung consolidation or pleural effusion.   Hepatobiliary: No focal liver abnormality is seen. No gallstones, gallbladder wall thickening, or biliary dilatation.   Pancreas: Unremarkable.   Spleen: Unremarkable.   Adrenals/Urinary Tract: Unremarkable adrenal glands. Unchanged 3 mm calculus in the interpolar right kidney. No ureteral calculi or hydroureteronephrosis. Unremarkable bladder.   Stomach/Bowel: The stomach is unremarkable.  There is no evidence of bowel obstruction or inflammation. The appendix is unremarkable.   Vascular/Lymphatic: Normal caliber of the abdominal aorta. No enlarged lymph nodes.   Reproductive: Unremarkable prostate.   Other: Small fat containing umbilical hernia. Fat containing left inguinal hernia. No ascites or pneumoperitoneum.   Musculoskeletal: No acute osseous abnormality or suspicious osseous lesion.   IMPRESSION: 1. No acute abnormality identified in the abdomen or pelvis. 2. Unchanged nonobstructing right renal calculus.     Electronically Signed   By: Aundra Lee M.D.   On: 12/31/2018 13:17   Assessment & Plan:    1. Right lower back pain Pain is localized to the lumbosacral area and is most likely of musculoskeletal etiology. Recommend orthopedic evaluation for further assessment and management.  2. Right nephrolithiasis KUB (Kidneys, Ureters, and Bladder) X-ray ordered to assess the current status of the renal calculus. Discussed the possibility of shockwave lithotripsy (SWL) if the stone is visualized on plain film, though the stone has been stable for over 10 years and is unlikely to cause any problems. Treatment of the stone, if still in its non-obstructing position, is unlikely to resolve the right lower back pain.  I have reviewed the above documentation for accuracy and completeness, and I agree with the above.   Tony Knapp, MD  [facility] [address] [place], [place] [address] [phone]

## 2023-07-07 LAB — URINALYSIS, COMPLETE
Bilirubin, UA: NEGATIVE
Glucose, UA: NEGATIVE
Ketones, UA: NEGATIVE
Leukocytes,UA: NEGATIVE
Nitrite, UA: NEGATIVE
Protein,UA: NEGATIVE
Specific Gravity, UA: <1.005 — ABNORMAL LOW (ref 1.005–1.030)
Urobilinogen, Ur: 0.2 mg/dL (ref 0.2–1.0)
pH, UA: 6 (ref 5.0–7.5)

## 2023-07-07 LAB — MICROSCOPIC EXAMINATION: Bacteria, UA: NONE SEEN

## 2023-07-08 ENCOUNTER — Encounter: Payer: Self-pay | Admitting: Urology

## 2023-07-10 ENCOUNTER — Ambulatory Visit: Payer: Self-pay | Admitting: Urology

## 2023-07-10 DIAGNOSIS — M545 Low back pain, unspecified: Secondary | ICD-10-CM

## 2023-07-10 NOTE — Telephone Encounter (Signed)
 Pt calls triage line and states he missed a call. Informed pt of result note per Dr. Cherylene Corrente. Pt voiced understanding. CT previously ordered.

## 2023-07-26 ENCOUNTER — Ambulatory Visit

## 2023-07-26 ENCOUNTER — Encounter

## 2023-07-26 DIAGNOSIS — Z09 Encounter for follow-up examination after completed treatment for conditions other than malignant neoplasm: Secondary | ICD-10-CM | POA: Diagnosis present

## 2023-07-26 DIAGNOSIS — K640 First degree hemorrhoids: Secondary | ICD-10-CM | POA: Diagnosis not present

## 2023-07-26 DIAGNOSIS — Z860101 Personal history of adenomatous and serrated colon polyps: Secondary | ICD-10-CM | POA: Diagnosis not present

## 2023-07-27 ENCOUNTER — Ambulatory Visit
Admission: RE | Admit: 2023-07-27 | Discharge: 2023-07-27 | Disposition: A | Discharge status: Home / Self Care | Source: Ambulatory Visit | Attending: Urology | Admitting: Urology

## 2023-07-27 DIAGNOSIS — M545 Low back pain, unspecified: Secondary | ICD-10-CM | POA: Diagnosis present

## 2023-07-29 ENCOUNTER — Ambulatory Visit: Payer: Self-pay | Admitting: Urology

## 2023-08-28 NOTE — Progress Notes (Signed)
 Established Patient Office Visit  Subjective:  Patient ID: Tony Galvan, male    DOB: Apr 08, 1975  Age: 48 y.o. MRN: [id number]  Chief Complaint  Patient presents with   Follow-up    2 month follow up     Patient is here today for his 2 months follow up.  He has been feeling fairly well since last appointment.   He does have additional concerns to discuss today.  Has been having some trouble sleeping, says that he took something that did help, but he needs a prescription.   Labs are due today. He needs refills.   I have reviewed his active problem list, medication list, allergies, notes from last encounter, lab results for his appointment today.      No other concerns at this time.   Past Medical History:  Diagnosis Date   Meningitis     Past Surgical History:  Procedure Laterality Date   IRRIGATION AND DEBRIDEMENT KNEE  01/02/2011   Procedure: IRRIGATION AND DEBRIDEMENT KNEE;  Surgeon: Cordella Glendia Hutchinson;  Location: [facility] OR;  Service: Orthopedics;  Laterality: Left;   TENDON REPAIR      Social History   Socioeconomic History   Marital status: Single    Spouse name: Not on file   Number of children: Not on file   Years of education: Not on file   Highest education level: Not on file  Occupational History   Not on file  Tobacco Use   Smoking status: Former    Current packs/day: 0.00    Types: Cigarettes    Quit date: 10/2020    Years since quitting: 2.8   Smokeless tobacco: Never  Vaping Use   Vaping status: Every Day  Substance and Sexual Activity   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   Drug use: No   Sexual activity: Yes  Other Topics Concern   Not on file  Social History Narrative   Not on file   Social Drivers of Health   Financial Resource Strain: Low Risk  (06/28/2023)   Received from [facility]   Overall Financial Resource Strain (CARDIA)    Difficulty of Paying Living Expenses: Not hard at all   Food Insecurity: No Food Insecurity (06/28/2023)   Received from [facility]   Hunger Vital Sign    Within the past 12 months, you worried that your food would run out before you got the money to buy more.: Never true    Within the past 12 months, the food you bought just didn't last and you didn't have money to get more.: Never true  Transportation Needs: No Transportation Needs (06/28/2023)   Received from [facility]    In the past 12 months, has lack of transportation kept you from medical appointments or from getting medications?: No    Lack of Transportation (Non-Medical): No  Physical Activity: Not on file  Stress: Not on file  Social Connections: Not on file  Intimate Partner Violence: Not on file    Family History  Problem Relation Age of Onset   Hypertension Father    Colon polyps Father    Cancer Paternal Grandfather        colon    Allergies  Allergen Reactions   Penicillins Anaphylaxis and Rash   Vicodin [Hydrocodone-Acetaminophen ] Itching    Review of Systems  Psychiatric/Behavioral:  The patient has insomnia.   All  other systems reviewed and are negative.      Objective:   BP 127/72   Pulse 78   Ht 5' 10 (1.778 m)   Wt 218 lb (98.9 kg)   SpO2 99%   BMI 31.28 kg/m   Vitals:   05/04/23 1419  BP: 127/72  Pulse: 78  Height: 5' 10 (1.778 m)  Weight: 218 lb (98.9 kg)  SpO2: 99%  BMI (Calculated): 31.28    Physical Exam Vitals and nursing note reviewed.  Constitutional:      Appearance: Normal appearance. He is normal weight.  Eyes:     Pupils: Pupils are equal, round, and reactive to light.  Cardiovascular:     Rate and Rhythm: Normal rate and regular rhythm.     Pulses: Normal pulses.     Heart sounds: Normal heart sounds.  Pulmonary:     Effort: Pulmonary effort is normal.     Breath sounds: Normal breath sounds.  Neurological:     Mental Status: He is alert.  Psychiatric:         Mood and Affect: Mood normal.        Behavior: Behavior normal.        Thought Content: Thought content normal.      No results found for any visits on 05/04/23.  Recent Results (from the past 2160 hours)  Urinalysis, Complete     Status: Abnormal   Collection Time: 07/06/23  2:39 PM  Result Value Ref Range   Specific Gravity, UA <1.005 (L) 1.005 - 1.030   pH, UA 6.0 5.0 - 7.5   Color, UA Yellow Yellow   Appearance Ur Clear Clear   Leukocytes,UA Negative Negative   Protein,UA Negative Negative/Trace   Glucose, UA Negative Negative   Ketones, UA Negative Negative   RBC, UA Trace (A) Negative   Bilirubin, UA Negative Negative   Urobilinogen, Ur 0.2 0.2 - 1.0 mg/dL   Nitrite, UA Negative Negative   Microscopic Examination See below:   Microscopic Examination     Status: None   Collection Time: 07/06/23  2:39 PM   Urine  Result Value Ref Range   WBC, UA 0-5 0 - 5 /hpf   RBC, Urine 0-2 0 - 2 /hpf   Epithelial Cells (non renal) 0-10 0 - 10 /hpf   Bacteria, UA None seen None seen/Few       Assessment & Plan Primary insomnia Sending RX for pt.  Will let me know if this does not help with his symptoms.   Obstructive sleep apnea syndrome Doing well with CPAP.  Will let me know if anything else is needed with his symptoms.   Prediabetes A1C Continues to be in prediabetic ranges.  Will reassess at follow up after next lab check.  Patient counseled on dietary choices and verbalized understanding.      No follow-ups on file.   Total time spent: 20 minutes  ALAN CHRISTELLA ARRANT, FNP  05/04/2023   This document may have been prepared by [facility] Voice Recognition software and as such may include unintentional dictation errors.

## 2023-08-28 NOTE — Assessment & Plan Note (Signed)
 A1C Continues to be in prediabetic ranges.  Will reassess at follow up after next lab check.  Patient counseled on dietary choices and verbalized understanding.

## 2023-08-28 NOTE — Assessment & Plan Note (Signed)
 Doing well with CPAP.  Will let me know if anything else is needed with his symptoms.

## 2023-09-14 ENCOUNTER — Ambulatory Visit: Payer: Self-pay | Admitting: General Surgery

## 2023-09-14 NOTE — H&P (View-Only) (Signed)
 History of Present Illness Tony Galvan is a 48 year old male who presents for evaluation of umbilical and inguinal hernias.  He has an umbilical hernia that is tender, especially when pressure is applied to the bulge.  No alleviating factors.  No pain radiation.  He first noticed this hernia approximately two weeks ago while using the bathroom. His sister, a nurse, confirmed the presence of the hernia after he sent her a picture.  The left groin hernia causes discomfort, particularly during physical activities, and is associated with a 'weird' feeling in the leg area. He has not noticed a bulge in the groin area himself but recalls having a hernia repair in the groin when he was [age] years old.  A CT scan of the abdomen and pelvis performed on July 27, 2023, noted the presence of the umbilical and left inguinal hernia while being conducted for kidney stones. He was not informed of the hernia at that time.  I personally evaluated the images.  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      PAST MEDICAL HISTORY:  Past Medical History:  Diagnosis Date  . Hyperlipidemia   . OSA (obstructive sleep apnea)   . Prediabetes         PAST SURGICAL HISTORY:   Past Surgical History:  Procedure Laterality Date  . KNEE ARTHROSCOPY N/A 09/15/2015  . Colon @ [facility]  06/22/2022   13 Tubular adenomas/FHx CP/Repeat 64yr/SMR         MEDICATIONS:  Outpatient Encounter Medications as of 09/14/2023  Medication Sig Dispense Refill  . ALPRAZolam  (XANAX ) 0.5 MG tablet Take 1 tablet (0.5 mg total) by mouth at bedtime as needed for Sleep 90 tablet 1   No facility-administered encounter medications on file as of 09/14/2023.     ALLERGIES:   Penicillins and Hydrocodone-acetaminophen    SOCIAL HISTORY:  Social History   Socioeconomic History  . Marital status: Single  Tobacco Use  . Smoking status: Former     Current packs/day: 0.50    Types: Cigarettes  . Smokeless tobacco: Never  Vaping Use  . Vaping status: Every Day  Substance and Sexual Activity  . Sexual activity: Defer    Partners: Female   Social Drivers of Corporate investment banker Strain: Low Risk  (06/28/2023)   Overall Financial Resource Strain (CARDIA)   . Difficulty of Paying Living Expenses: Not hard at all  Food Insecurity: No Food Insecurity (06/28/2023)   Hunger Vital Sign   . Worried About Programme researcher, broadcasting/film/video in the Last Year: Never true   . Ran Out of Food in the Last Year: Never true  Transportation Needs: No Transportation Needs (06/28/2023)   PRAPARE - Transportation   . Lack of Transportation (Medical): No   . Lack of Transportation (Non-Medical): No    FAMILY HISTORY:  No family history on file.   GENERAL REVIEW OF SYSTEMS:   General ROS: negative for - chills, fatigue, fever, weight gain or weight loss Allergy and Immunology ROS: negative for - hives  Hematological and Lymphatic ROS: negative for - bleeding problems or bruising, negative for palpable nodes Endocrine ROS: negative for - heat or cold intolerance, hair changes Respiratory ROS: negative for - cough, shortness of breath or wheezing Cardiovascular ROS: no chest pain or palpitations GI ROS: negative for nausea, vomiting, abdominal pain, diarrhea, constipation Musculoskeletal ROS: negative for - joint swelling or muscle pain Neurological ROS:  negative for - confusion, syncope Dermatological ROS: negative for pruritus and rash  PHYSICAL EXAM:  Vitals:   09/14/23 0820  BP: 124/73  Pulse: 66  .  Ht:177.8 cm (5' 10) Wt:96.2 kg (212 lb) ADJ:Anib surface area is 2.18 meters squared. Body mass index is 30.42 kg/m.SABRA   GENERAL: Alert, active, oriented x3  HEENT: Pupils equal reactive to light. Extraocular movements are intact. Sclera clear. Palpebral conjunctiva normal red color.Pharynx clear.  NECK: Supple with no palpable mass and no  adenopathy.  LUNGS: Sound clear with no rales rhonchi or wheezes.  HEART: Regular rhythm S1 and S2 without murmur.  ABDOMEN: Soft and depressible, nontender with no palpable mass, no hepatomegaly.  Small umbilical hernia, tender to palpation, not completely reducible.  Left inguinal hernia, reducible.  EXTREMITIES: Well-developed well-nourished symmetrical with no dependent edema.  NEUROLOGICAL: Awake alert oriented, facial expression symmetrical, moving all extremities.   Results CT abdomen and pelvis (07/27/2023): Small umbilical hernia and inguinal hernia with some adipose tissue, incidental finding during evaluation for nephrolithiasis.    Assessment & Plan Umbilical hernia   A small umbilical hernia identified on the July 27, 2023 CT scan causes tenderness and pain during activities. There is a low risk of strangulation due to its small size but it is incarcerated and painful on palpation.  We discussed about recommendation of repair of symptomatic umbilical hernia.  We discussed about the risk including bleeding, infection, injury to adjacent organ, among others.  Inguinal hernia   An inguinal hernia in the left groin area causes discomfort and an unusual sensation during activities. There is no noticeable bulge or significant symptoms of strangulation. Differential diagnosis includes muscular or tendon issues. Schedule surgical repair with laparoscopic surgery using three small incisions in the mid-upper abdomen. Place mesh to reinforce the inguinal canal.  Discussed about the risk including bleeding, infection, chronic pain, injury to bladder, injury to adjacent organ, among others.  Advise on post-operative care, including the use of ice packs, monitoring for bruising, and avoiding heavy lifting for two weeks.   Umbilical hernia without obstruction and without gangrene [K42.9]          Patient verbalized understanding, all questions were answered, and were agreeable with the plan  outlined above.   Lucas Sjogren, MD  Electronically signed by Lucas Sjogren, MD

## 2023-09-19 ENCOUNTER — Encounter
Admission: RE | Admit: 2023-09-19 | Discharge: 2023-09-19 | Disposition: A | Discharge status: Home / Self Care | Source: Ambulatory Visit | Attending: General Surgery | Admitting: General Surgery

## 2023-09-19 ENCOUNTER — Other Ambulatory Visit: Payer: Self-pay

## 2023-09-19 HISTORY — DX: Anxiety disorder, unspecified: F41.9

## 2023-09-19 HISTORY — DX: Prediabetes: R73.03

## 2023-09-19 HISTORY — DX: Personal history of urinary calculi: Z87.442

## 2023-09-19 HISTORY — DX: Sleep apnea, unspecified: G47.30

## 2023-09-19 HISTORY — DX: Hyperlipidemia, unspecified: E78.5

## 2023-09-19 MED ORDER — CELECOXIB 200 MG PO CAPS
200.0000 mg | ORAL_CAPSULE | ORAL | Status: AC
  Administered 2023-09-20: 200 mg via ORAL

## 2023-09-19 MED ORDER — LACTATED RINGERS IV SOLN: INTRAVENOUS | Status: DC

## 2023-09-19 MED ORDER — GABAPENTIN 300 MG PO CAPS
300.0000 mg | ORAL_CAPSULE | ORAL | Status: AC
  Administered 2023-09-20: 300 mg via ORAL

## 2023-09-19 MED ORDER — BUPIVACAINE LIPOSOME 1.3 % IJ SUSP: 20.0000 mL | Freq: Once | INTRAMUSCULAR | Status: DC

## 2023-09-19 MED ORDER — CHLORHEXIDINE GLUCONATE 0.12 % MT SOLN
15.0000 mL | Freq: Once | OROMUCOSAL | Status: AC
  Administered 2023-09-20: 15 mL via OROMUCOSAL

## 2023-09-19 MED ORDER — ORAL CARE MOUTH RINSE: 15.0000 mL | Freq: Once | OROMUCOSAL | Status: AC

## 2023-09-19 MED ORDER — VANCOMYCIN HCL IN DEXTROSE 1-5 GM/200ML-% IV SOLN
1000.0000 mg | INTRAVENOUS | Status: AC
  Administered 2023-09-20: 1000 mg via INTRAVENOUS

## 2023-09-19 NOTE — Patient Instructions (Signed)
 Your procedure is scheduled on: Tomorrow Report to the Registration Desk on the 1st floor of the [facility] To find out your arrival time, please call [phone] between 1PM - 3PM on: Today If your arrival time is 6:00 am, do not arrive before that time as the [facility] entrance doors do not open until 6:00 am.  REMEMBER: Instructions that are not followed completely may result in serious medical risk, up to and including death; or upon the discretion of your surgeon and anesthesiologist your surgery may need to be rescheduled.  Do not eat food or drink any liquids after midnight tonight.  No gum chewing or hard candies.  One week prior to surgery: Stop Anti-inflammatories (NSAIDS) such as Advil, Aleve , Ibuprofen, Motrin, Naproxen , Naprosyn  and Aspirin  based products such as Excedrin, Goody's Powder, BC Powder.  You may however, continue to take Tylenol  if needed for pain up until the day of surgery.  Stop ALL OVER THE COUNTER supplements and vitamins until after surgery.  Continue taking all of your other prescription medications up until the day of surgery.  ON THE DAY OF SURGERY ONLY TAKE THESE MEDICATIONS WITH SIPS OF WATER:  none  No Alcohol for 24 hours before or after surgery.  No Smoking including e-cigarettes for 24 hours before surgery.  No chewable tobacco products for at least 6 hours before surgery.  No nicotine patches on the day of surgery.  Do not use any recreational drugs for at least a week (preferably 2 weeks) before your surgery.  Please be advised that the combination of cocaine and anesthesia may have negative outcomes, up to and including death. If you test positive for cocaine, your surgery will be cancelled.  On the morning of surgery brush your teeth with toothpaste and water, you may rinse your mouth with mouthwash if you wish. Do not swallow any toothpaste or mouthwash.  Shower prior to arrival for surgery.  Do not wear lotions, powders,  or cologne.  Do not shave body hair from the neck down 48 hours before surgery.  Wear comfortable clothing (specific to your surgery type) to the hospital.  Do not wear jewelry etc.  For welded (permanent) jewelry: bracelets, anklets, waist bands, etc.  Please have this removed prior to surgery.  If it is not removed, there is a chance that hospital personnel will need to cut it off on the day of surgery.  Contact lenses, hearing aids and dentures may not be worn into surgery.  Do not bring valuables to the hospital. [facility] is not responsible for any missing/lost belongings or valuables.   Bring your C-PAP to the hospital    Notify your doctor if there is any change in your medical condition (cold, fever, infection).  After surgery, you can help prevent lung complications by doing breathing exercises.  Take deep breaths and cough every 1-2 hours. Your doctor may order a device called an Incentive Spirometer to help you take deep breaths. When coughing or sneezing, hold a pillow firmly against your incision with both hands. This is called "splinting." Doing this helps protect your incision. It also decreases belly discomfort.  If you are being discharged the day of surgery, you will not be allowed to drive home. You will need a responsible individual to drive you home and stay with you for 24 hours after surgery.   Please call the Pre-admissions Testing Dept. at [phone] if you have any questions about these instructions.  Surgery Visitation Policy:  Patients  having surgery or a procedure may have two visitors.  Children under the age of 23 must have an adult with them who is not the patient.   Merchandiser, retail to address health-related social needs:  [email or web address]

## 2023-09-20 ENCOUNTER — Ambulatory Visit: Payer: Self-pay | Admitting: Anesthesiology

## 2023-09-20 ENCOUNTER — Other Ambulatory Visit: Payer: Self-pay

## 2023-09-20 ENCOUNTER — Encounter: Payer: Self-pay | Admitting: General Surgery

## 2023-09-20 ENCOUNTER — Ambulatory Visit
Admission: RE | Admit: 2023-09-20 | Discharge: 2023-09-20 | Disposition: A | Discharge status: Home / Self Care | Attending: General Surgery | Admitting: General Surgery

## 2023-09-20 ENCOUNTER — Encounter
Admission: RE | Disposition: A | Discharge status: Home / Self Care | Payer: Self-pay | Source: Home / Self Care | Attending: General Surgery

## 2023-09-20 DIAGNOSIS — G4733 Obstructive sleep apnea (adult) (pediatric): Secondary | ICD-10-CM | POA: Diagnosis not present

## 2023-09-20 DIAGNOSIS — K4090 Unilateral inguinal hernia, without obstruction or gangrene, not specified as recurrent: Secondary | ICD-10-CM | POA: Diagnosis present

## 2023-09-20 DIAGNOSIS — Z87891 Personal history of nicotine dependence: Secondary | ICD-10-CM | POA: Insufficient documentation

## 2023-09-20 DIAGNOSIS — K420 Umbilical hernia with obstruction, without gangrene: Secondary | ICD-10-CM | POA: Diagnosis not present

## 2023-09-20 HISTORY — PX: XI ROBOTIC ASSISTED INGUINAL HERNIA REPAIR WITH MESH: SHX6706

## 2023-09-20 HISTORY — PX: UMBILICAL HERNIA REPAIR: SHX196

## 2023-09-20 SURGERY — REPAIR, HERNIA, INGUINAL, ROBOT-ASSISTED, LAPAROSCOPIC, USING MESH
Anesthesia: General | Site: Groin

## 2023-09-20 MED ORDER — ONDANSETRON HCL 4 MG/2ML IJ SOLN
INTRAMUSCULAR | Status: AC
  Filled 2023-09-20: qty 2

## 2023-09-20 MED ORDER — OXYCODONE HCL 5 MG/5ML PO SOLN: 5.0000 mg | Freq: Once | ORAL | Status: AC | PRN

## 2023-09-20 MED ORDER — PROPOFOL 1000 MG/100ML IV EMUL
INTRAVENOUS | Status: AC
  Filled 2023-09-20: qty 100

## 2023-09-20 MED ORDER — ACETAMINOPHEN 10 MG/ML IV SOLN
INTRAVENOUS | Status: DC | PRN
  Administered 2023-09-20: 1000 mg via INTRAVENOUS

## 2023-09-20 MED ORDER — BUPIVACAINE-EPINEPHRINE 0.25% -1:200000 IJ SOLN
INTRAMUSCULAR | Status: DC | PRN
  Administered 2023-09-20: 20 mL

## 2023-09-20 MED ORDER — SEVOFLURANE IN SOLN
RESPIRATORY_TRACT | Status: AC
  Filled 2023-09-20: qty 250

## 2023-09-20 MED ORDER — OXYCODONE HCL 5 MG PO TABS
5.0000 mg | ORAL_TABLET | Freq: Once | ORAL | Status: AC | PRN
  Administered 2023-09-20: 5 mg via ORAL

## 2023-09-20 MED ORDER — PROPOFOL 500 MG/50ML IV EMUL
INTRAVENOUS | Status: DC | PRN
  Administered 2023-09-20: 100 ug/kg/min via INTRAVENOUS
  Administered 2023-09-20: 125 ug/kg/min via INTRAVENOUS

## 2023-09-20 MED ORDER — LIDOCAINE HCL (PF) 2 % IJ SOLN
INTRAMUSCULAR | Status: AC
  Filled 2023-09-20: qty 5

## 2023-09-20 MED ORDER — FENTANYL CITRATE (PF) 100 MCG/2ML IJ SOLN
INTRAMUSCULAR | Status: DC | PRN
  Administered 2023-09-20 (×2): 50 ug via INTRAVENOUS

## 2023-09-20 MED ORDER — FENTANYL CITRATE (PF) 100 MCG/2ML IJ SOLN
25.0000 ug | INTRAMUSCULAR | Status: DC | PRN
  Administered 2023-09-20: 25 ug via INTRAVENOUS
  Administered 2023-09-20: 50 ug via INTRAVENOUS
  Administered 2023-09-20: 25 ug via INTRAVENOUS

## 2023-09-20 MED ORDER — VANCOMYCIN HCL IN DEXTROSE 1-5 GM/200ML-% IV SOLN
INTRAVENOUS | Status: AC
  Filled 2023-09-20: qty 200

## 2023-09-20 MED ORDER — CHLORHEXIDINE GLUCONATE 0.12 % MT SOLN
OROMUCOSAL | Status: AC
  Filled 2023-09-20: qty 15

## 2023-09-20 MED ORDER — CELECOXIB 200 MG PO CAPS
ORAL_CAPSULE | ORAL | Status: AC
  Filled 2023-09-20: qty 1

## 2023-09-20 MED ORDER — DEXAMETHASONE SODIUM PHOSPHATE 10 MG/ML IJ SOLN
INTRAMUSCULAR | Status: AC
  Filled 2023-09-20: qty 1

## 2023-09-20 MED ORDER — DEXAMETHASONE SODIUM PHOSPHATE 10 MG/ML IJ SOLN
INTRAMUSCULAR | Status: DC | PRN
  Administered 2023-09-20: 4 mg via INTRAVENOUS

## 2023-09-20 MED ORDER — GABAPENTIN 300 MG PO CAPS
ORAL_CAPSULE | ORAL | Status: AC
  Filled 2023-09-20: qty 1

## 2023-09-20 MED ORDER — PROPOFOL 10 MG/ML IV BOLUS
INTRAVENOUS | Status: AC
  Filled 2023-09-20: qty 40

## 2023-09-20 MED ORDER — LIDOCAINE HCL (CARDIAC) PF 100 MG/5ML IV SOSY
PREFILLED_SYRINGE | INTRAVENOUS | Status: DC | PRN
Start: 2023-09-20 — End: 2023-09-20
  Administered 2023-09-20: 100 mg via INTRAVENOUS

## 2023-09-20 MED ORDER — TRAMADOL HCL 50 MG PO TABS
50.0000 mg | ORAL_TABLET | Freq: Four times a day (QID) | ORAL | 0 refills | Status: AC | PRN
Start: 2023-09-20 — End: 2024-09-19

## 2023-09-20 MED ORDER — ACETAMINOPHEN 10 MG/ML IV SOLN
INTRAVENOUS | Status: AC
  Filled 2023-09-20: qty 100

## 2023-09-20 MED ORDER — ROCURONIUM BROMIDE 10 MG/ML (PF) SYRINGE
PREFILLED_SYRINGE | INTRAVENOUS | Status: AC
  Filled 2023-09-20: qty 10

## 2023-09-20 MED ORDER — OXYCODONE HCL 5 MG PO TABS
ORAL_TABLET | ORAL | Status: AC
  Filled 2023-09-20: qty 1

## 2023-09-20 MED ORDER — PROPOFOL 10 MG/ML IV BOLUS
INTRAVENOUS | Status: DC | PRN
  Administered 2023-09-20: 160 mg via INTRAVENOUS

## 2023-09-20 MED ORDER — MIDAZOLAM HCL 2 MG/2ML IJ SOLN
INTRAMUSCULAR | Status: DC | PRN
  Administered 2023-09-20: 2 mg via INTRAVENOUS

## 2023-09-20 MED ORDER — 0.9 % SODIUM CHLORIDE (POUR BTL) OPTIME
TOPICAL | Status: DC | PRN
  Administered 2023-09-20: 500 mL

## 2023-09-20 MED ORDER — MIDAZOLAM HCL 2 MG/2ML IJ SOLN
INTRAMUSCULAR | Status: AC
Start: 2023-09-20 — End: 2023-09-20
  Filled 2023-09-20: qty 2

## 2023-09-20 MED ORDER — ONDANSETRON HCL 4 MG/2ML IJ SOLN
INTRAMUSCULAR | Status: DC | PRN
  Administered 2023-09-20: 4 mg via INTRAVENOUS

## 2023-09-20 MED ORDER — ROCURONIUM BROMIDE 100 MG/10ML IV SOLN
INTRAVENOUS | Status: DC | PRN
  Administered 2023-09-20: 80 mg via INTRAVENOUS
  Administered 2023-09-20: 20 mg via INTRAVENOUS

## 2023-09-20 MED ORDER — SUGAMMADEX SODIUM 500 MG/5ML IV SOLN
INTRAVENOUS | Status: DC | PRN
  Administered 2023-09-20: 200 mg via INTRAVENOUS

## 2023-09-20 MED ORDER — BUPIVACAINE-EPINEPHRINE (PF) 0.25% -1:200000 IJ SOLN
INTRAMUSCULAR | Status: AC
  Filled 2023-09-20: qty 30

## 2023-09-20 MED ORDER — FENTANYL CITRATE (PF) 100 MCG/2ML IJ SOLN
INTRAMUSCULAR | Status: AC
  Filled 2023-09-20: qty 2

## 2023-09-20 MED ORDER — LACTATED RINGERS IV SOLN: INTRAVENOUS | Status: DC | PRN

## 2023-09-20 MED ORDER — SUCCINYLCHOLINE CHLORIDE 200 MG/10ML IV SOSY
PREFILLED_SYRINGE | INTRAVENOUS | Status: AC
  Filled 2023-09-20: qty 10

## 2023-09-20 MED ORDER — FENTANYL CITRATE (PF) 100 MCG/2ML IJ SOLN
INTRAMUSCULAR | Status: AC
Start: 2023-09-20 — End: 2023-09-20
  Filled 2023-09-20: qty 2

## 2023-09-20 SURGICAL SUPPLY — 42 items
BAG PRESSURE INF REUSE 1000 (BAG) IMPLANT
COVER TIP SHEARS 8 DVNC (MISCELLANEOUS) ×2 IMPLANT
COVER WAND RF STERILE (DRAPES) ×2 IMPLANT
DEFOGGER SCOPE WARM SEASHARP (MISCELLANEOUS) ×2 IMPLANT
DERMABOND ADVANCED .7 DNX12 (GAUZE/BANDAGES/DRESSINGS) ×2 IMPLANT
DRAPE ARM DVNC X/XI (DISPOSABLE) ×6 IMPLANT
DRAPE COLUMN DVNC XI (DISPOSABLE) ×2 IMPLANT
ELECTRODE REM PT RTRN 9FT ADLT (ELECTROSURGICAL) ×2 IMPLANT
FORCEPS BPLR FENES DVNC XI (FORCEP) ×2 IMPLANT
GLOVE BIO SURGEON STRL SZ 6.5 (GLOVE) ×4 IMPLANT
GLOVE BIOGEL PI IND STRL 6.5 (GLOVE) ×4 IMPLANT
GLOVE SURG SYN 6.5 PF PI (GLOVE) ×4 IMPLANT
GOWN STRL REUS W/ TWL LRG LVL3 (GOWN DISPOSABLE) ×8 IMPLANT
IRRIGATOR SUCT 8 DISP DVNC XI (IRRIGATION / IRRIGATOR) IMPLANT
IV CATH ANGIO 12GX3 LT BLUE (NEEDLE) IMPLANT
IV NS 1000ML BAXH (IV SOLUTION) IMPLANT
KIT PINK PAD W/HEAD ARM REST (MISCELLANEOUS) ×2 IMPLANT
LABEL OR SOLS (LABEL) IMPLANT
MANIFOLD NEPTUNE II (INSTRUMENTS) ×2 IMPLANT
MESH 3DMAX MID 5X7 LT XLRG (Mesh General) IMPLANT
NDL DRIVE SUT CUT DVNC (INSTRUMENTS) ×2 IMPLANT
NDL HYPO 22X1.5 SAFETY MO (MISCELLANEOUS) ×2 IMPLANT
NDL INSUFFLATION 14GA 120MM (NEEDLE) ×2 IMPLANT
NEEDLE DRIVE SUT CUT DVNC (INSTRUMENTS) ×2 IMPLANT
NEEDLE HYPO 22X1.5 SAFETY MO (MISCELLANEOUS) ×2 IMPLANT
NEEDLE INSUFFLATION 14GA 120MM (NEEDLE) ×2 IMPLANT
NS IRRIG 500ML POUR BTL (IV SOLUTION) IMPLANT
OBTURATOR OPTICALSTD 8 DVNC (TROCAR) ×2 IMPLANT
PACK LAP CHOLECYSTECTOMY (MISCELLANEOUS) ×2 IMPLANT
SCISSORS MNPLR CVD DVNC XI (INSTRUMENTS) ×2 IMPLANT
SEAL UNIV 5-12 XI (MISCELLANEOUS) ×6 IMPLANT
SET TUBE SMOKE EVAC HIGH FLOW (TUBING) ×2 IMPLANT
SOLUTION ELECTROSURG ANTI STCK (MISCELLANEOUS) ×2 IMPLANT
SUT ETHIBOND 0 (SUTURE) IMPLANT
SUT STRATA 2-0 23CM CT-2 (SUTURE) ×2 IMPLANT
SUT VIC AB 2-0 SH 27XBRD (SUTURE) ×2 IMPLANT
SUT VIC AB 2-0 UR6 27 (SUTURE) IMPLANT
SUT VIC AB 3-0 SH 27X BRD (SUTURE) IMPLANT
SUTURE MNCRL 4-0 27XMF (SUTURE) ×2 IMPLANT
TAPE TRANSPORE STRL 2 31045 (GAUZE/BANDAGES/DRESSINGS) IMPLANT
TRAP FLUID SMOKE EVACUATOR (MISCELLANEOUS) ×2 IMPLANT
TRAY FOLEY MTR SLVR 16FR STAT (SET/KITS/TRAYS/PACK) ×2 IMPLANT

## 2023-09-20 NOTE — Transfer of Care (Signed)
 Immediate Anesthesia Transfer of Care Note  Patient: Tony Galvan  Procedure(s) Performed: REPAIR, HERNIA, INGUINAL, ROBOT-ASSISTED, LAPAROSCOPIC, USING MESH (Left: Groin) REPAIR, HERNIA, UMBILICAL, ADULT (Abdomen)  Patient Location: PACU  Anesthesia Type:General  Level of Consciousness: drowsy, patient cooperative, and responds to stimulation  Airway & Oxygen Therapy: Patient Spontanous Breathing  Post-op Assessment: Report given to RN and Post -op Vital signs reviewed and stable  Post vital signs: Reviewed  Last Vitals:  Vitals Value Taken Time  BP 114/83   Temp    Pulse 73 09/20/23 09:42  Resp 17 09/20/23 09:42  SpO2 99 % 09/20/23 09:42  Vitals shown include unfiled device data.  Last Pain:  Vitals:   09/20/23 0615  TempSrc: Temporal  PainSc: 4          Complications: No notable events documented.

## 2023-09-20 NOTE — Interval H&P Note (Signed)
 History and Physical Interval Note:  09/20/2023 7:11 AM  Tony Galvan  has presented today for surgery, with the diagnosis of K40.90 non recurrent unilateral inguinal hernia w/o obstruction or gangrene K42.9 umbilical hernia w/o obstruction or gangrene.  The various methods of treatment have been discussed with the patient and family. After consideration of risks, benefits and other options for treatment, the patient has consented to  Procedure(s): REPAIR, HERNIA, INGUINAL, ROBOT-ASSISTED, LAPAROSCOPIC, USING MESH (Left) as a surgical intervention.  The patient's history has been reviewed, patient examined, no change in status, stable for surgery.  I have reviewed the patient's chart and labs.  Questions were answered to the patient's satisfaction.     Lucas Sjogren

## 2023-09-20 NOTE — Anesthesia Preprocedure Evaluation (Signed)
 Anesthesia Evaluation  Patient identified by MRN, date of birth, ID band Patient awake    Reviewed: Allergy & Precautions, NPO status , Patient's Chart, lab work & pertinent test results  History of Anesthesia Complications Negative for: history of anesthetic complications  Airway Mallampati: III  TM Distance: <3 FB Neck ROM: full    Dental  (+) Chipped, Poor Dentition, Missing   Pulmonary neg shortness of breath, sleep apnea and Continuous Positive Airway Pressure Ventilation , former smoker   Pulmonary exam normal        Cardiovascular Exercise Tolerance: Good (-) angina (-) Past MI and (-) DOE Normal cardiovascular exam     Neuro/Psych negative neurological ROS     GI/Hepatic negative GI ROS, Neg liver ROS,neg GERD  ,,  Endo/Other  negative endocrine ROS    Renal/GU      Musculoskeletal   Abdominal   Peds  Hematology negative hematology ROS (+)   Anesthesia Other Findings Past Medical History: No date: Anxiety No date: History of kidney stones No date: HLD (hyperlipidemia) No date: Meningitis No date: Pre-diabetes No date: Sleep apnea  Past Surgical History: 01/02/2011: IRRIGATION AND DEBRIDEMENT KNEE     Comment:  Procedure: IRRIGATION AND DEBRIDEMENT KNEE;  Surgeon:               Cordella Glendia Hutchinson;  Location: [facility] OR;  Service:               Orthopedics;  Laterality: Left; No date: TENDON REPAIR; Right     Comment:  mcl and acl  BMI    Body Mass Index: 30.42 kg/m      Reproductive/Obstetrics negative OB ROS                              Anesthesia Physical Anesthesia Plan  ASA: 3  Anesthesia Plan: General ETT   Post-op Pain Management:    Induction: Intravenous  PONV Risk Score and Plan: Ondansetron , Dexamethasone , Midazolam  and Treatment may vary due to age or medical condition  Airway Management Planned: Oral ETT  Additional Equipment:   Intra-op Plan:    Post-operative Plan: Extubation in OR  Informed Consent: I have reviewed the patients History and Physical, chart, labs and discussed the procedure including the risks, benefits and alternatives for the proposed anesthesia with the patient or authorized representative who has indicated his/her understanding and acceptance.     Dental Advisory Given  Plan Discussed with: Anesthesiologist, CRNA and Surgeon  Anesthesia Plan Comments: (Patient consented for risks of anesthesia including but not limited to:  - adverse reactions to medications - damage to eyes, teeth, lips or other oral mucosa - nerve damage due to positioning  - sore throat or hoarseness - Damage to heart, brain, nerves, lungs, other parts of body or loss of life  Patient voiced understanding and assent.)        Anesthesia Quick Evaluation

## 2023-09-20 NOTE — Progress Notes (Signed)
 Patient able to ambulate to the bathroom and urinate without assistance.

## 2023-09-20 NOTE — Progress Notes (Signed)
 Cintron-Diaz in with patient.

## 2023-09-20 NOTE — Anesthesia Postprocedure Evaluation (Signed)
 Anesthesia Post Note  Patient: Tony Galvan  Procedure(s) Performed: REPAIR, HERNIA, INGUINAL, ROBOT-ASSISTED, LAPAROSCOPIC, USING MESH (Left: Groin) REPAIR, HERNIA, UMBILICAL, ADULT (Abdomen)  Patient location during evaluation: PACU Anesthesia Type: General Level of consciousness: awake and alert Pain management: pain level controlled Vital Signs Assessment: post-procedure vital signs reviewed and stable Respiratory status: spontaneous breathing, nonlabored ventilation, respiratory function stable and patient connected to nasal cannula oxygen Cardiovascular status: blood pressure returned to baseline and stable Postop Assessment: no apparent nausea or vomiting Anesthetic complications: no   There were no known notable events for this encounter.   Last Vitals:  Vitals:   09/20/23 1030 09/20/23 1043  BP: (!) 102/50 101/76  Pulse: (!) 46 (!) 56  Resp: 10 15  Temp:  36.6 C  SpO2: 95% 100%    Last Pain:  Vitals:   09/20/23 1043  TempSrc: Temporal  PainSc: 6                  Lynwood KANDICE Clause

## 2023-09-20 NOTE — Discharge Instructions (Signed)

## 2023-09-20 NOTE — Anesthesia Procedure Notes (Signed)
 Procedure Name: Intubation Date/Time: 09/20/2023 7:38 AM  Performed by: Bonnetta Jimmey SAUNDERS, CRNAPre-anesthesia Checklist: Patient identified, Emergency Drugs available, Suction available and Patient being monitored Patient Re-evaluated:Patient Re-evaluated prior to induction Oxygen Delivery Method: Circle system utilized Preoxygenation: Pre-oxygenation with 100% oxygen Induction Type: IV induction Ventilation: Mask ventilation without difficulty Laryngoscope Size: McGrath and 4 Grade View: Grade I Tube type: Oral Tube size: 7.0 mm Number of attempts: 1 Airway Equipment and Method: Stylet and Oral airway Placement Confirmation: ETT inserted through vocal cords under direct vision, positive ETCO2 and breath sounds checked- equal and bilateral Secured at: 23 cm Tube secured with: Tape Dental Injury: Teeth and Oropharynx as per pre-operative assessment

## 2023-09-20 NOTE — Op Note (Signed)
 Preoperative diagnosis: Left inguinal hernia.                                             Umbilical hernia  Postoperative diagnosis: Left inguinal hernia.                                             Umbilical hernia  Procedure: Robotic assisted Laparoscopic Transabdominal preperitoneal laparoscopic (TAPP) repair of left inguinal hernia. Umbilical hernia repair   Anesthesia: GETA  Surgeon: Dr. Cesar Coe  Wound Classification: Clean  Indications:  Patient is a 48 y.o. male developed a symptomatic umbilical and left inguinal hernia. Repair was indicated.  Findings: 1. Left indirect Inguinal hernia identified 2. 1 cm incarcerated fat umbilical hernia 3. Vas deferens and cord structures identified and preserved 4. Bard Extra Large 3D Max MID Anatomical mesh used for repair of left inguinal hernia  5. Adequate hemostasis.   Description of procedure:  The patient was taken to the operating room and the correct side of surgery was verified. The patient was placed supine with right arm tucked at the side. After obtaining adequate anesthesia, the patient's abdomen was prepped and draped in standard sterile fashion. A time-out was completed verifying correct patient, procedure, site, positioning, and implant(s) and/or special equipment prior to beginning this procedure.  An incision was made in a natural skin line above the umbilicus. The fascia was elevated and the Veress needle inserted. Proper position was confirmed by aspiration and saline meniscus test.  The abdomen was insufflated with carbon dioxide to a pressure of 15 mmHg. The patient tolerated insufflation well.  Abdominal cavity was entered using Optiview technique with a millimeter trocar.  No injury was identified.  Another 2 mm trocars were placed lateral to each rectus muscle.  Scissors and bipolar forceps were inserted under direct visualization. At the robotic console: Transverse peritoneal incision is made about 8 cm superior to  the inguinal defect. Medial to the epigastric vessels, the parietal compartment is dissected to visualize the rectus muscle. This is carried down to the symphysis pubis and the retropubic space is dissected to expose at least 2 cm contralateral to the midline. Cooper's ligament is exposed and cleared at least 2 cm below the ligament to ensure adequate space for the inferior border of the mesh. Hesselbach's triangle is cleared assessing for a direct hernia. Lateral to the epigastric vessels, the dissection is carried out in visceral compartment continuing in the true preperitoneal plane. Indirect hernia sac, was carefully reduced and separated from the cord structures with medial retraction and a combination of blunt/sharp dissection and focused cautery. This dissection was continued until the cord structures are "parietalized" completely, allowing for visualization of the reflected peritoneum that is continuous with the line originating 2 cm below Coopers medially and across the psoas muscle in the lateral compartment.  The internal ring was interrogated for a cord lipoma. The cord lipoma was reduced to the retroperitoneum and seated dorsal to the preperitoneal mesh. Having achieved a complete dissection with a critical view of the entire myopectineal orifice, an XL mesh was then positioned centered at the iliopubic tract with the medial side crossing the midline and the inferior edge positioned 2 cm below Coopers ligament. The lateral aspect of the  mesh extended 3-5 cm beyond the lateral edge of the psoas. The mesh is fixated using an interrupted suture placed to the ipsilateral Coopers ligament. A second suture was done at the medial superior aspect of the mesh fixating this to the rectus complex.  The peritoneal flap is closed with running barbed suture. Additional holes in the peritoneum closed with suture. Preperitoneal space gas aspirated to visualize the peritoneum apposed directly against the mesh and  ensure no folding, lifting, or buckling of the mesh.  Then I proceeded to repair the umbilical hernia. Through a new supra umbilical incision, the incision was deepened to the fascia. The hernia sac was then identified and dissected free. The peritoneum of the sac was dissected from the umbilical stalk and able to be reduced easily. The fascia was carefully palpated no additional defects were identified. The hernia defect was closed with interrupted 0 Ethibond suture on midline. The umbilical stalk was fixed to fascia with 2-0 Vicryl and the skin was closed with subcuticular sutures of Monocryl 4-0. The trocars incisions were closed as well, sterile dressings are applied.  The patient tolerated the procedure well and was taken to the postanesthesia care unit in stable condition  Specimen: None  Complications: None  Estimated Blood Loss: 5 mL

## 2023-09-21 ENCOUNTER — Encounter: Payer: Self-pay | Admitting: General Surgery
